# Patient Record
Sex: Male | Born: 1970 | Race: White | Hispanic: No | Marital: Married | Smoking: Never smoker
Health system: Southern US, Community
[De-identification: ages and names within clinical notes are randomized; demographics above are authoritative.]

## PROBLEM LIST (undated history)

## (undated) DIAGNOSIS — I471 Supraventricular tachycardia, unspecified: Secondary | ICD-10-CM

## (undated) DIAGNOSIS — E7800 Pure hypercholesterolemia, unspecified: Secondary | ICD-10-CM

## (undated) DIAGNOSIS — I10 Essential (primary) hypertension: Secondary | ICD-10-CM

## (undated) DIAGNOSIS — I499 Cardiac arrhythmia, unspecified: Secondary | ICD-10-CM

## (undated) HISTORY — PX: CARDIAC ELECTROPHYSIOLOGY MAPPING AND ABLATION: SHX1292

## (undated) HISTORY — PX: EYE SURGERY: SHX253

---

## 2004-09-30 ENCOUNTER — Other Ambulatory Visit: Payer: Self-pay

## 2004-09-30 ENCOUNTER — Emergency Department: Payer: Self-pay | Admitting: Emergency Medicine

## 2004-12-11 ENCOUNTER — Emergency Department: Payer: Self-pay | Admitting: Emergency Medicine

## 2005-02-28 ENCOUNTER — Emergency Department (HOSPITAL_COMMUNITY): Admission: EM | Admit: 2005-02-28 | Discharge: 2005-03-01 | Payer: Self-pay | Admitting: Emergency Medicine

## 2005-06-26 ENCOUNTER — Emergency Department (HOSPITAL_COMMUNITY): Admission: EM | Admit: 2005-06-26 | Discharge: 2005-06-26 | Payer: Self-pay | Admitting: Emergency Medicine

## 2005-07-22 ENCOUNTER — Emergency Department: Payer: Self-pay | Admitting: Emergency Medicine

## 2005-08-06 ENCOUNTER — Emergency Department (HOSPITAL_COMMUNITY): Admission: EM | Admit: 2005-08-06 | Discharge: 2005-08-06 | Payer: Self-pay | Admitting: Emergency Medicine

## 2006-04-29 ENCOUNTER — Emergency Department (HOSPITAL_COMMUNITY): Admission: EM | Admit: 2006-04-29 | Discharge: 2006-04-29 | Payer: Self-pay | Admitting: Emergency Medicine

## 2006-08-31 ENCOUNTER — Emergency Department: Payer: Self-pay | Admitting: Emergency Medicine

## 2007-02-15 ENCOUNTER — Emergency Department: Payer: Self-pay | Admitting: Emergency Medicine

## 2007-03-07 ENCOUNTER — Other Ambulatory Visit: Payer: Self-pay

## 2007-03-07 ENCOUNTER — Emergency Department: Payer: Self-pay | Admitting: Emergency Medicine

## 2007-09-02 ENCOUNTER — Emergency Department: Payer: Self-pay | Admitting: Emergency Medicine

## 2007-10-23 ENCOUNTER — Emergency Department: Payer: Self-pay | Admitting: Emergency Medicine

## 2008-01-16 ENCOUNTER — Emergency Department: Payer: Self-pay | Admitting: Unknown Physician Specialty

## 2008-01-20 ENCOUNTER — Emergency Department: Payer: Self-pay | Admitting: Emergency Medicine

## 2008-04-08 ENCOUNTER — Emergency Department: Payer: Self-pay | Admitting: Emergency Medicine

## 2008-07-08 ENCOUNTER — Emergency Department: Payer: Self-pay | Admitting: Emergency Medicine

## 2008-12-11 ENCOUNTER — Emergency Department: Payer: Self-pay | Admitting: Emergency Medicine

## 2009-03-10 ENCOUNTER — Emergency Department (HOSPITAL_COMMUNITY): Admission: EM | Admit: 2009-03-10 | Discharge: 2009-03-11 | Payer: Self-pay | Admitting: Emergency Medicine

## 2009-05-26 ENCOUNTER — Emergency Department: Payer: Self-pay | Admitting: Unknown Physician Specialty

## 2009-05-31 ENCOUNTER — Inpatient Hospital Stay: Payer: Self-pay | Admitting: Internal Medicine

## 2009-08-20 ENCOUNTER — Emergency Department: Payer: Self-pay | Admitting: Unknown Physician Specialty

## 2009-10-23 ENCOUNTER — Emergency Department: Payer: Self-pay | Admitting: Emergency Medicine

## 2009-11-05 ENCOUNTER — Emergency Department: Payer: Self-pay | Admitting: Unknown Physician Specialty

## 2010-02-20 ENCOUNTER — Emergency Department: Payer: Self-pay | Admitting: Emergency Medicine

## 2010-03-12 ENCOUNTER — Emergency Department: Payer: Self-pay | Admitting: Emergency Medicine

## 2010-03-22 ENCOUNTER — Emergency Department: Payer: Self-pay | Admitting: Emergency Medicine

## 2010-05-01 ENCOUNTER — Emergency Department: Payer: Self-pay | Admitting: Emergency Medicine

## 2010-05-04 LAB — POCT I-STAT, CHEM 8
Chloride: 100 mEq/L (ref 96–112)
Creatinine, Ser: 1.3 mg/dL (ref 0.4–1.5)
HCT: 45 % (ref 39.0–52.0)
Hemoglobin: 15.3 g/dL (ref 13.0–17.0)
Potassium: 3.4 mEq/L — ABNORMAL LOW (ref 3.5–5.1)
Sodium: 136 mEq/L (ref 135–145)

## 2010-05-04 LAB — URINALYSIS, ROUTINE W REFLEX MICROSCOPIC
Ketones, ur: NEGATIVE mg/dL
Nitrite: NEGATIVE
Protein, ur: NEGATIVE mg/dL
Urobilinogen, UA: 1 mg/dL (ref 0.0–1.0)

## 2010-05-12 ENCOUNTER — Emergency Department: Payer: Self-pay | Admitting: Unknown Physician Specialty

## 2010-05-14 ENCOUNTER — Emergency Department: Payer: Self-pay | Admitting: Emergency Medicine

## 2010-10-07 ENCOUNTER — Emergency Department (HOSPITAL_COMMUNITY): Payer: Self-pay

## 2010-10-07 ENCOUNTER — Emergency Department (HOSPITAL_COMMUNITY)
Admission: EM | Admit: 2010-10-07 | Discharge: 2010-10-07 | Disposition: A | Discharge status: Home / Self Care | Payer: Self-pay | Attending: Emergency Medicine | Admitting: Emergency Medicine

## 2010-10-07 DIAGNOSIS — E785 Hyperlipidemia, unspecified: Secondary | ICD-10-CM | POA: Insufficient documentation

## 2010-10-07 DIAGNOSIS — R079 Chest pain, unspecified: Secondary | ICD-10-CM | POA: Insufficient documentation

## 2010-10-07 DIAGNOSIS — I2510 Atherosclerotic heart disease of native coronary artery without angina pectoris: Secondary | ICD-10-CM | POA: Insufficient documentation

## 2010-10-07 DIAGNOSIS — R21 Rash and other nonspecific skin eruption: Secondary | ICD-10-CM | POA: Insufficient documentation

## 2010-10-07 LAB — COMPREHENSIVE METABOLIC PANEL
ALT: 12 U/L (ref 0–53)
AST: 18 U/L (ref 0–37)
Albumin: 3.9 g/dL (ref 3.5–5.2)
Alkaline Phosphatase: 102 U/L (ref 39–117)
Calcium: 9.1 mg/dL (ref 8.4–10.5)
Glucose, Bld: 93 mg/dL (ref 70–99)
Potassium: 4.1 mEq/L (ref 3.5–5.1)
Sodium: 140 mEq/L (ref 135–145)
Total Protein: 7.3 g/dL (ref 6.0–8.3)

## 2010-10-07 LAB — CBC
HCT: 44.1 % (ref 39.0–52.0)
MCHC: 33.6 g/dL (ref 30.0–36.0)
MCV: 87.2 fL (ref 78.0–100.0)
Platelets: 269 10*3/uL (ref 150–400)
RDW: 12.9 % (ref 11.5–15.5)
WBC: 7.5 10*3/uL (ref 4.0–10.5)

## 2010-10-18 ENCOUNTER — Emergency Department: Payer: Self-pay | Admitting: Emergency Medicine

## 2010-11-20 ENCOUNTER — Emergency Department: Payer: Self-pay | Admitting: Emergency Medicine

## 2011-01-18 ENCOUNTER — Emergency Department: Payer: Self-pay | Admitting: Emergency Medicine

## 2011-03-05 ENCOUNTER — Emergency Department: Payer: Self-pay | Admitting: Unknown Physician Specialty

## 2011-07-12 ENCOUNTER — Emergency Department: Payer: Self-pay | Admitting: Emergency Medicine

## 2011-07-12 LAB — BASIC METABOLIC PANEL
BUN: 18 mg/dL (ref 7–18)
Calcium, Total: 8.7 mg/dL (ref 8.5–10.1)
EGFR (African American): >60

## 2011-07-12 LAB — CBC
HGB: 15.8 g/dL (ref 13.0–18.0)
MCH: 30.6 pg (ref 26.0–34.0)
MCHC: 34.2 g/dL (ref 32.0–36.0)
MCV: 89 fL (ref 80–100)
RBC: 5.17 10*6/uL (ref 4.40–5.90)

## 2011-07-12 LAB — CK TOTAL AND CKMB (NOT AT ARMC): CK, Total: 184 U/L (ref 35–232)

## 2011-07-21 ENCOUNTER — Ambulatory Visit: Payer: Self-pay | Admitting: Surgery

## 2011-07-21 LAB — COMPREHENSIVE METABOLIC PANEL
Alkaline Phosphatase: 126 U/L (ref 50–136)
Anion Gap: 7 (ref 7–16)
BUN: 18 mg/dL (ref 7–18)
Bilirubin,Total: 0.3 mg/dL (ref 0.2–1.0)
Calcium, Total: 8.8 mg/dL (ref 8.5–10.1)
Co2: 30 mmol/L (ref 21–32)
EGFR (African American): >60
Potassium: 3.9 mmol/L (ref 3.5–5.1)
SGPT (ALT): 31 U/L
Total Protein: 7.5 g/dL (ref 6.4–8.2)

## 2011-07-21 LAB — CBC WITH DIFFERENTIAL/PLATELET
Eosinophil #: 0.3 10*3/uL (ref 0.0–0.7)
Eosinophil %: 2.4 %
Lymphocyte %: 27.3 %
MCH: 30.8 pg (ref 26.0–34.0)
Monocyte %: 7.7 %
Neutrophil #: 6.5 10*3/uL (ref 1.4–6.5)
Platelet: 270 10*3/uL (ref 150–440)
RDW: 12.9 % (ref 11.5–14.5)

## 2011-07-22 ENCOUNTER — Ambulatory Visit: Payer: Self-pay | Admitting: Surgery

## 2011-07-30 ENCOUNTER — Emergency Department: Payer: Self-pay | Admitting: Emergency Medicine

## 2011-07-30 LAB — CBC
HGB: 16.1 g/dL (ref 13.0–18.0)
MCH: 30.1 pg (ref 26.0–34.0)
MCV: 91 fL (ref 80–100)
RBC: 5.37 10*6/uL (ref 4.40–5.90)
WBC: 11.8 10*3/uL — ABNORMAL HIGH (ref 3.8–10.6)

## 2011-07-30 LAB — URINALYSIS, COMPLETE
Bacteria: NONE SEEN
Bilirubin,UR: NEGATIVE
Blood: NEGATIVE
Ketone: NEGATIVE
Nitrite: NEGATIVE
Protein: NEGATIVE
RBC,UR: 7 /HPF (ref 0–5)
Specific Gravity: 1.027 (ref 1.003–1.030)
Squamous Epithelial: 1

## 2011-07-30 LAB — LIPASE, BLOOD: Lipase: 72 U/L — ABNORMAL LOW (ref 73–393)

## 2011-07-30 LAB — COMPREHENSIVE METABOLIC PANEL
Albumin: 3.8 g/dL (ref 3.4–5.0)
BUN: 15 mg/dL (ref 7–18)
Co2: 28 mmol/L (ref 21–32)
EGFR (African American): >60
SGOT(AST): 22 U/L (ref 15–37)
Sodium: 138 mmol/L (ref 136–145)

## 2011-08-04 ENCOUNTER — Emergency Department: Payer: Self-pay | Admitting: Emergency Medicine

## 2011-08-04 LAB — CBC
HCT: 41.4 % (ref 40.0–52.0)
HGB: 14 g/dL (ref 13.0–18.0)
MCHC: 33.9 g/dL (ref 32.0–36.0)
MCV: 89 fL (ref 80–100)
Platelet: 253 10*3/uL (ref 150–440)
RBC: 4.65 10*6/uL (ref 4.40–5.90)

## 2011-08-04 LAB — BASIC METABOLIC PANEL
Anion Gap: 7 (ref 7–16)
Chloride: 105 mmol/L (ref 98–107)
Glucose: 104 mg/dL — ABNORMAL HIGH (ref 65–99)
Sodium: 141 mmol/L (ref 136–145)

## 2011-08-04 LAB — URINALYSIS, COMPLETE
Bacteria: NONE SEEN
Bilirubin,UR: NEGATIVE
Blood: NEGATIVE
Glucose,UR: NEGATIVE mg/dL (ref 0–75)
Ph: 6 (ref 4.5–8.0)
Protein: NEGATIVE
RBC,UR: 1 /HPF (ref 0–5)
Specific Gravity: 1.02 (ref 1.003–1.030)
WBC UR: 1 /HPF (ref 0–5)

## 2011-08-07 LAB — URINALYSIS, COMPLETE
Bilirubin,UR: NEGATIVE
Blood: NEGATIVE
Leukocyte Esterase: NEGATIVE
Nitrite: NEGATIVE
Ph: 5 (ref 4.5–8.0)
Specific Gravity: 1.028 (ref 1.003–1.030)

## 2011-08-07 LAB — COMPREHENSIVE METABOLIC PANEL
BUN: 14 mg/dL (ref 7–18)
Bilirubin,Total: 0.5 mg/dL (ref 0.2–1.0)
Chloride: 103 mmol/L (ref 98–107)
Co2: 29 mmol/L (ref 21–32)
Creatinine: 1.3 mg/dL (ref 0.60–1.30)
EGFR (Non-African Amer.): >60
Glucose: 86 mg/dL (ref 65–99)
Sodium: 139 mmol/L (ref 136–145)

## 2011-08-07 LAB — CBC
HGB: 14.8 g/dL (ref 13.0–18.0)
MCH: 29.6 pg (ref 26.0–34.0)
MCV: 89 fL (ref 80–100)
RBC: 5.02 10*6/uL (ref 4.40–5.90)
RDW: 12.8 % (ref 11.5–14.5)
WBC: 14.5 10*3/uL — ABNORMAL HIGH (ref 3.8–10.6)

## 2011-08-08 ENCOUNTER — Inpatient Hospital Stay: Payer: Self-pay | Admitting: Surgery

## 2011-08-08 LAB — CBC WITH DIFFERENTIAL/PLATELET
Basophil #: 0 10*3/uL (ref 0.0–0.1)
Eosinophil #: 0.3 10*3/uL (ref 0.0–0.7)
MCHC: 34 g/dL (ref 32.0–36.0)
MCV: 88 fL (ref 80–100)
Neutrophil #: 6.8 10*3/uL — ABNORMAL HIGH (ref 1.4–6.5)
Neutrophil %: 62.7 %
WBC: 10.8 10*3/uL — ABNORMAL HIGH (ref 3.8–10.6)

## 2011-08-09 LAB — BASIC METABOLIC PANEL
Calcium, Total: 8.2 mg/dL — ABNORMAL LOW (ref 8.5–10.1)
Chloride: 108 mmol/L — ABNORMAL HIGH (ref 98–107)
Creatinine: 1.27 mg/dL (ref 0.60–1.30)
Osmolality: 284 (ref 275–301)
Potassium: 3.9 mmol/L (ref 3.5–5.1)

## 2011-08-09 LAB — CBC WITH DIFFERENTIAL/PLATELET
Basophil #: 0 10*3/uL (ref 0.0–0.1)
Basophil %: 0.5 %
HCT: 42 % (ref 40.0–52.0)
HGB: 13.8 g/dL (ref 13.0–18.0)
MCH: 29.1 pg (ref 26.0–34.0)
MCHC: 32.8 g/dL (ref 32.0–36.0)
Monocyte #: 1.1 x10 3/mm — ABNORMAL HIGH (ref 0.2–1.0)
Neutrophil #: 5.1 10*3/uL (ref 1.4–6.5)
Neutrophil %: 53.2 %
RBC: 4.73 10*6/uL (ref 4.40–5.90)
RDW: 12.7 % (ref 11.5–14.5)

## 2011-08-10 LAB — KOH PREP

## 2012-02-15 ENCOUNTER — Emergency Department: Payer: Self-pay | Admitting: Emergency Medicine

## 2012-02-15 LAB — CBC
Platelet: 296 10*3/uL (ref 150–440)
RBC: 5.37 10*6/uL (ref 4.40–5.90)
RDW: 13.5 % (ref 11.5–14.5)
WBC: 12.8 10*3/uL — ABNORMAL HIGH (ref 3.8–10.6)

## 2012-02-15 LAB — BASIC METABOLIC PANEL
Anion Gap: 9 (ref 7–16)
Calcium, Total: 8.5 mg/dL (ref 8.5–10.1)
Co2: 28 mmol/L (ref 21–32)
EGFR (African American): >60
EGFR (Non-African Amer.): >60
Osmolality: 281 (ref 275–301)

## 2012-02-15 LAB — TROPONIN I: Troponin-I: <0.02 ng/mL

## 2012-03-07 ENCOUNTER — Emergency Department: Payer: Self-pay | Admitting: Emergency Medicine

## 2012-03-07 LAB — COMPREHENSIVE METABOLIC PANEL
Alkaline Phosphatase: 126 U/L (ref 50–136)
Anion Gap: 6 — ABNORMAL LOW (ref 7–16)
Bilirubin,Total: 0.4 mg/dL (ref 0.2–1.0)
Calcium, Total: 8.4 mg/dL — ABNORMAL LOW (ref 8.5–10.1)
Creatinine: 1.33 mg/dL — ABNORMAL HIGH (ref 0.60–1.30)
EGFR (Non-African Amer.): >60
Osmolality: 280 (ref 275–301)
Potassium: 3.6 mmol/L (ref 3.5–5.1)
SGOT(AST): 19 U/L (ref 15–37)
Sodium: 140 mmol/L (ref 136–145)
Total Protein: 7.5 g/dL (ref 6.4–8.2)

## 2012-03-07 LAB — CBC
HCT: 45.6 % (ref 40.0–52.0)
MCV: 89 fL (ref 80–100)
Platelet: 228 10*3/uL (ref 150–440)
RBC: 5.13 10*6/uL (ref 4.40–5.90)

## 2012-03-08 LAB — PROTIME-INR
INR: 0.9
Prothrombin Time: 12.9 secs (ref 11.5–14.7)

## 2013-01-23 ENCOUNTER — Emergency Department: Payer: Self-pay | Admitting: Emergency Medicine

## 2013-01-23 LAB — BASIC METABOLIC PANEL
Anion Gap: 6 — ABNORMAL LOW (ref 7–16)
BUN: 18 mg/dL (ref 7–18)
EGFR (African American): >60

## 2013-01-23 LAB — CBC
HGB: 15.5 g/dL (ref 13.0–18.0)
MCH: 29.7 pg (ref 26.0–34.0)
Platelet: 281 10*3/uL (ref 150–440)

## 2013-01-23 LAB — TROPONIN I: Troponin-I: <0.02 ng/mL

## 2013-01-23 LAB — MAGNESIUM: Magnesium: 2.2 mg/dL

## 2013-06-10 ENCOUNTER — Emergency Department: Payer: Self-pay | Admitting: Emergency Medicine

## 2013-06-11 ENCOUNTER — Emergency Department: Payer: Self-pay | Admitting: Emergency Medicine

## 2014-02-08 IMAGING — US ABDOMEN ULTRASOUND LIMITED
1 series · 14 of 25 positions shown · non-contrast
Comparison: none

REASON FOR EXAM: RUQ pain, nausea
COMMENTS:   Body Site: GB and Fossa, CBD, Head of Pancreas

PROCEDURE:     US  - US ABDOMEN LIMITED SURVEY  - July 13, 2011  [DATE]
RESULT:

[Series 1: abdomen ultrasound limited · 0.31mm/px · 14 of 32 slices shown]
[im 1/32]
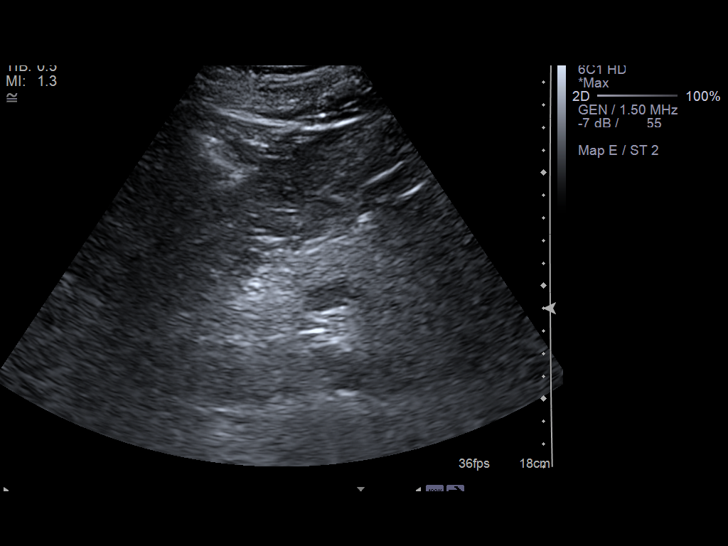
[im 3/32]
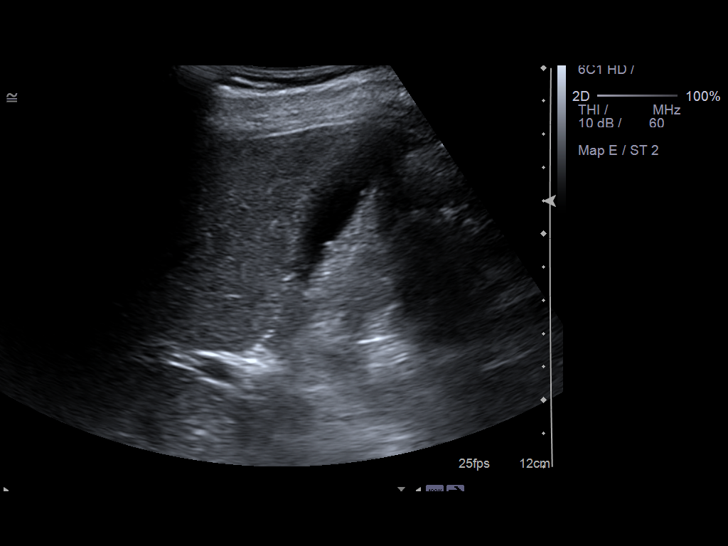
[im 6/32]
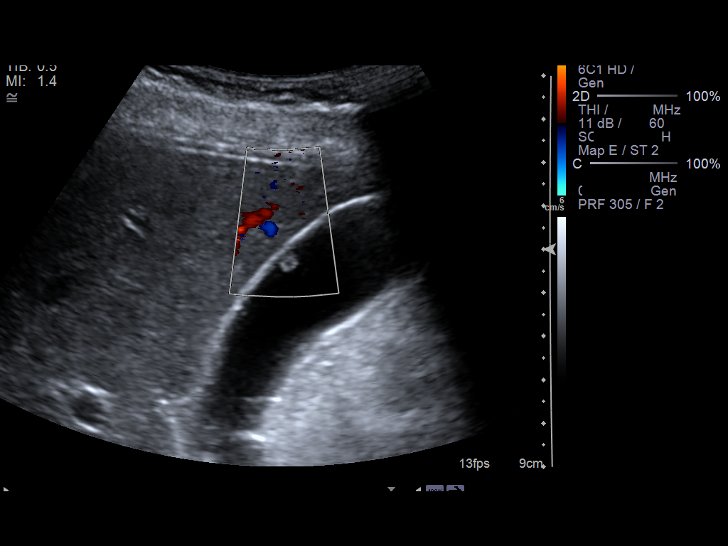
[im 8/32]
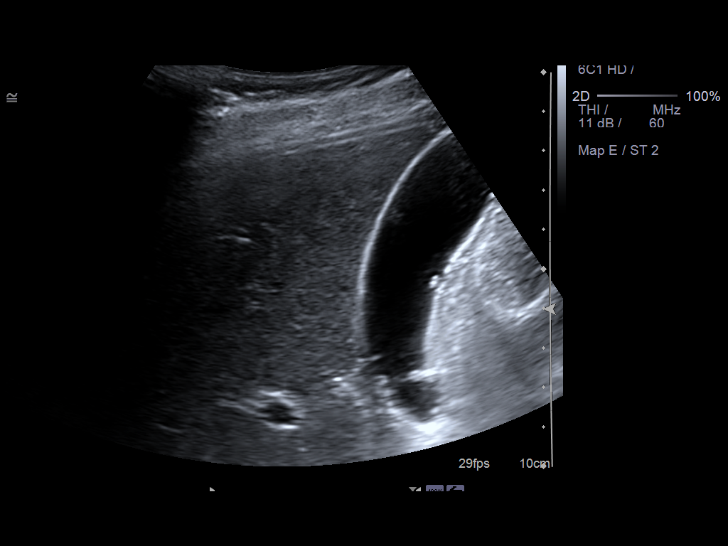
[im 11/32]
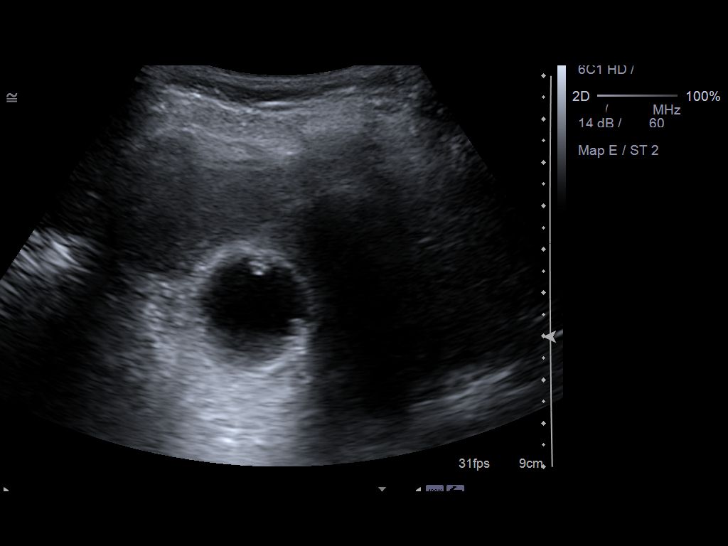
[im 12/32]
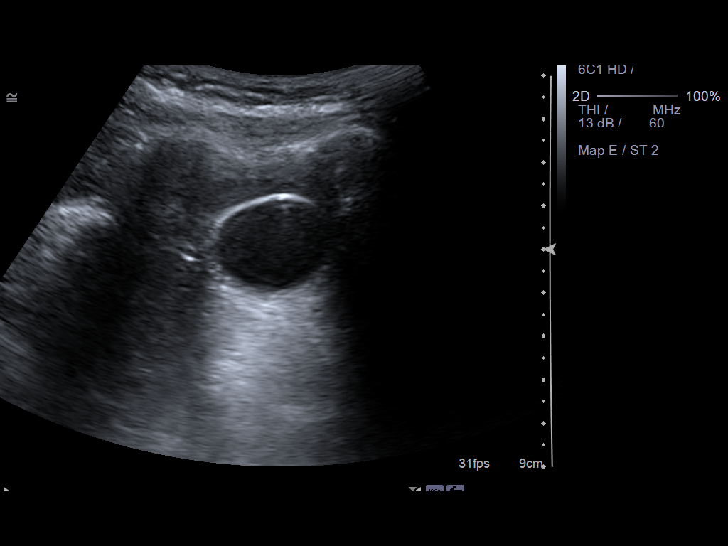
[im 15/32]
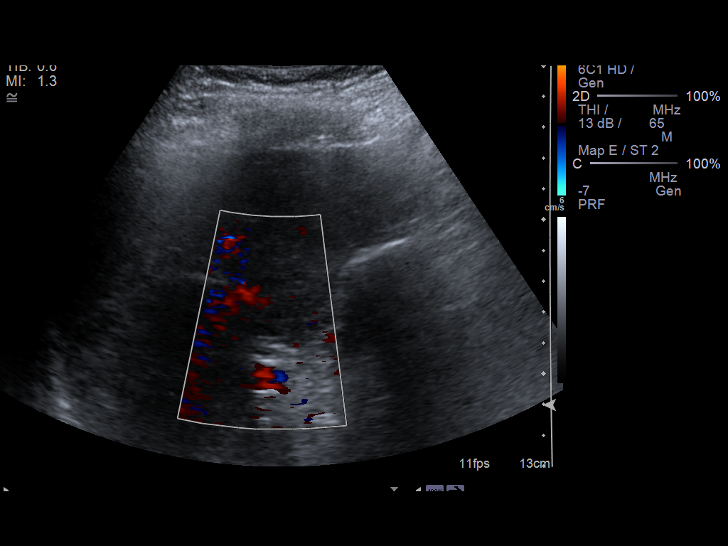
[im 17/32]
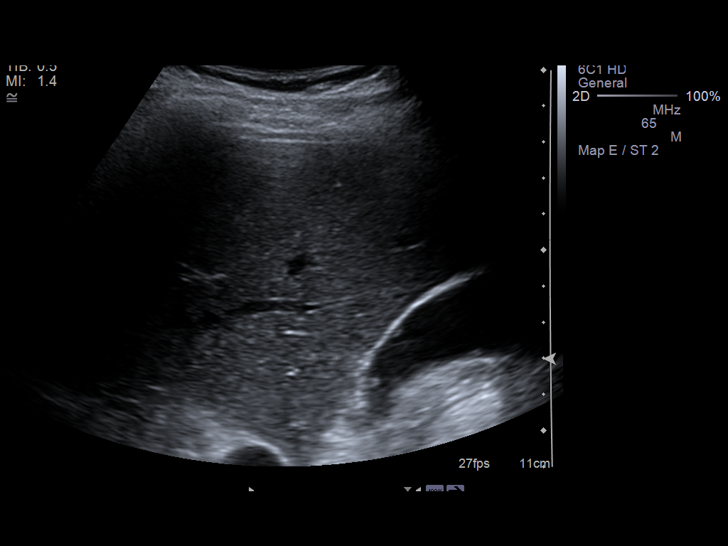
[im 20/32]
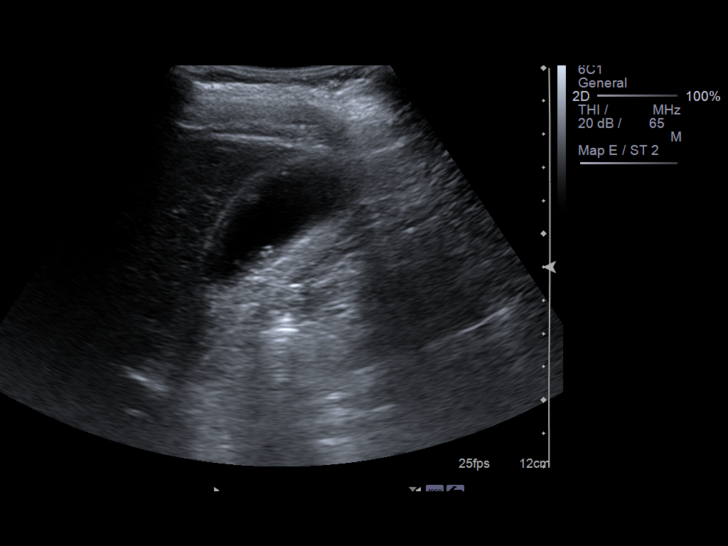
[im 21/32]
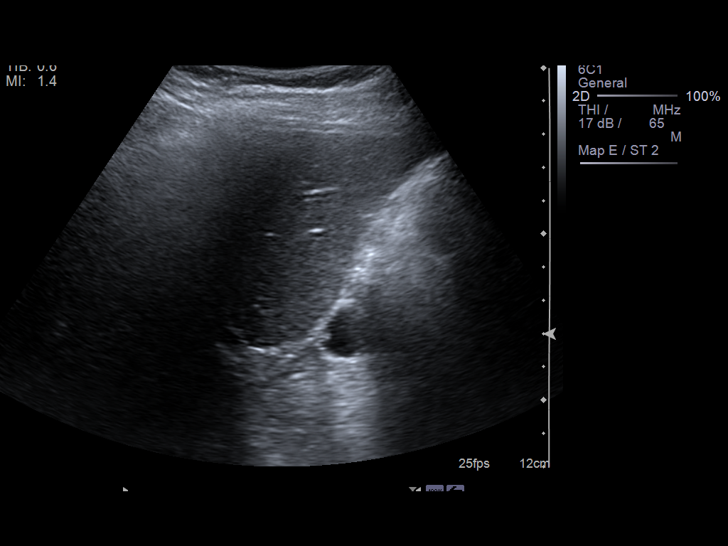
[im 24/32]
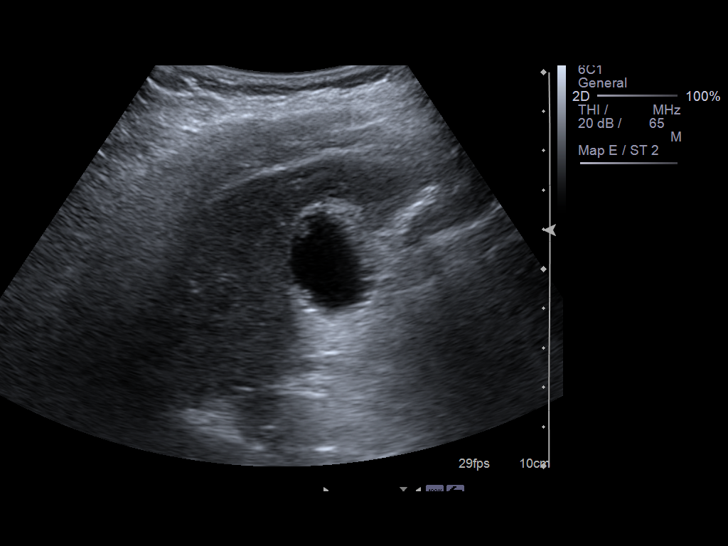
[im 26/32]
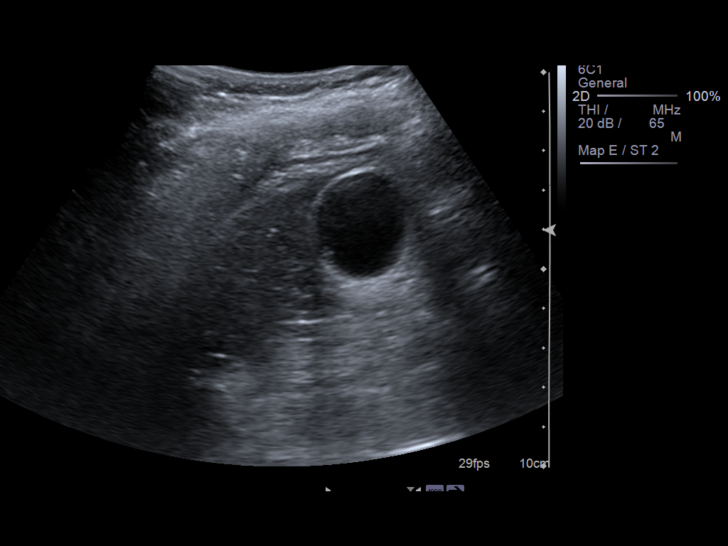
[im 29/32]
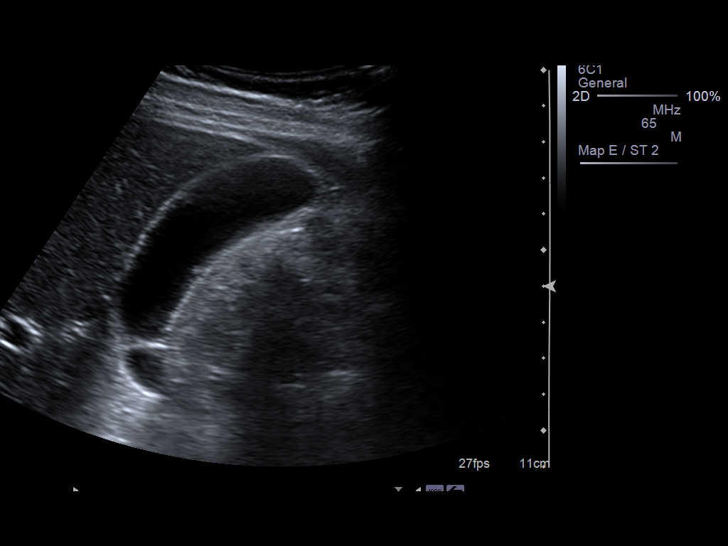
[im 32/32]
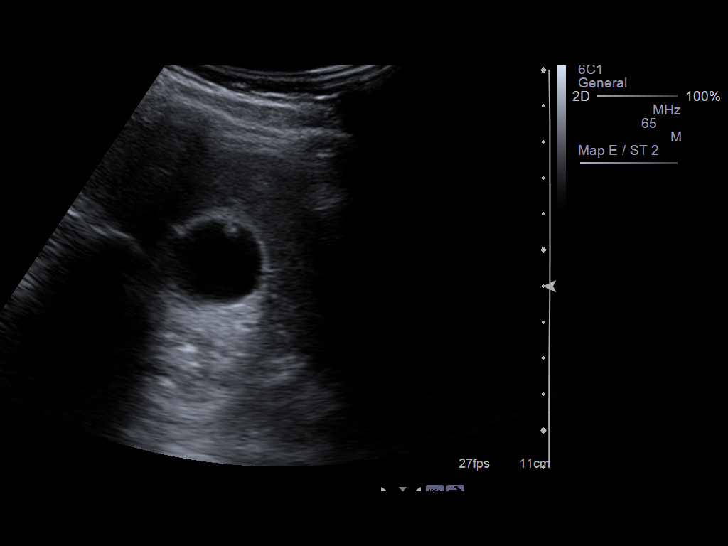

[14 of 25 positions shown; findings below may reference images not displayed]

FINDINGS: Evaluation of the gallbladder demonstrates multiple small
echogenic nonshadowing mural foci within the gallbladder like representing
multiple polyps. There is no evidence of gallbladder wall thickening,
pericholecystic fluid or common bile duct dilatation. The common bile duct
is 3.5 mm in thickness and the gallbladder wall is 2 mm. Hepatopetal flow is
identified within the portal vein. The pancreatic head is unremarkable.

There does not appear to be sonographic evidence of sludging. Adherent
gallstones within the gallbladder wall region likely reflecting polyps
cannot be completely excluded.
IMPRESSION: 1.  No sonographic evidence of cholecystitis.
2.  Findings likely representing polyps within the gallbladder and small,
adherent gallstones, noncalcified, cannot be excluded.
3.  Dr. Verenice of the Emergency Department was informed of these findings via
a preliminary faxed report dated 07/13/2011 at [DATE], EDT.

## 2014-06-10 NOTE — Consult Note (Signed)
PATIENT NAME:  Collin Mitchell, Collin Mitchell MR#:  [id number] DATE OF BIRTH:  1970/04/22  DATE OF CONSULTATION:  08/08/2011  REFERRING PHYSICIAN:  Dr. Michela Pitcher  CONSULTING PHYSICIAN:  Christena Deem, MD  REASON FOR CONSULTATION: Hematemesis, abdominal pain.   HISTORY OF PRESENT ILLNESS: Mr. Neels is a 44 year old Caucasian male who came to the hospital earlier today with complaint of abdominal pain. He has been seen recently in regards to possible cholecystectomy and cholecystitis. There is apparently some plan for cholecystectomy. He states that about a week ago he had multiple symptoms begin to include nausea as well as epigastric and right upper quadrant pain. He has no personal history of peptic ulcer disease and has been on medication for reflux up until about a month ago when he ran out of the prescription. He denies any dysphagia. He said he does have heartburn every night. He states he has never had an upper scope done. He does take Marlin Canary powders about once a month, the last time this previous Monday. He states that when these symptoms began they got worse over the course of the week until on Wednesday he had several episodes of bloody emesis. He relates this not as been a bright red or coffee-ground or maroon material, but rather quotes it as being "pinkish". This has not recurred since Thursday, the following day to the initial symptom. He states that he did have a black bowel movement on Thursday but that has not recurred either. He was seen at [facility] ER on the 17th and was told at that time he had pneumonia, was given a course of Levaquin. Three days ago he presented to the Emergency Room at [facility] with abdominal pain. At that time he had relatively normal labs and a chest x-ray was unremarkable. He was told to continue his antibiotics and went home. He does relate having night sweats over the past week or so and has continued with epigastric and right upper quadrant pain. Again, he has had no recurrent episode of  emesis or hematemesis since the past 2 to 3 days.   GI FAMILY HISTORY: Negative for colorectal cancer, liver disease, or ulcers.   PAST MEDICAL HISTORY:  1. He was admitted to [facility] for atypical chest pain in 2011 at that time undergoing cardiac evaluation which was normal.  2. History of hypertension which patient states is mild and he does not take any medications for that.  3. History of hypercholesterolemia for which he was on Lipitor; that drug being discontinued due to abnormal liver associated enzymes.  4. He denies any other ongoing medical treatment or hospitalizations or surgeries.   SOCIAL HISTORY: Patient does not use cigarettes and he does not use alcohol. 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    REVIEW OF SYSTEMS: Patient continues to have a cough which has been relatively stable over the past several weeks, was productive a week ago, currently not as bad. It is of note the patient relates having a lot of coughing prior to the episode of emesis at which time he saw the pinkish material.    ALLERGIES: Penicillin.   OUTPATIENT MEDICATIONS: Levaquin 750 mg once a day. He had previously been on Protonix.   PHYSICAL EXAMINATION:  VITAL SIGNS: Temperature 97.7, pulse 59, respirations 16, blood pressure 121/80, pulse oximetry 97% on room air.   GENERAL: He is a 44 year old Caucasian male coughing multiple times during course of the interview. This seems to be nonproductive.  HEENT: Normocephalic, atraumatic. Eyes are anicteric. Nose septum midline. No lesions. Oropharynx no lesions.   NECK: Supple. No JVD. No lymphadenopathy. No thyromegaly.   HEART: Regular rate and rhythm.   LUNGS: Clear.   ABDOMEN: Soft. He is tender to palpation in the mid epigastric region as well as into the right upper quadrant just below the costal margin itself. He also has some discomfort to palpation in the right lower quadrant. There are no masses, rebound, or  organomegaly. Bowel sounds positive, normoactive.   RECTAL: Anorectal exam is deferred.   EXTREMITIES: No clubbing, cyanosis, or edema.   NEUROLOGICAL: Cranial nerves II through XII grossly intact. Muscle strength bilaterally equal and symmetric, 5/5. Deep tendon reflexes bilaterally equal and symmetric.   MEDICATIONS: It is of note his current medications include:  1. Antinausea medication. 2. Ondansetron. 3. Hydromorphone. 4. Pantoprazole. 5. Zosyn.  6. Morphine.   LABORATORY, DIAGNOSTIC, AND RADIOLOGICAL DATA: On admission to the hospital yesterday he had a glucose 86, BUN 14, creatinine 1.30, sodium 139, potassium 3.8, chloride 103, bicarbonate 29, lipase 88. Normal liver associated enzymes. His hemogram yesterday showed white count 14.5, hemoglobin and hematocrit 14.8/44.6, platelet count 363. MCV 89. Repeat hemogram today shows white count 10.8. Urinalysis 30 mg/dL protein. Yesterday he had a PA and lateral chest film showing no acute cardiopulmonary disease and a "stable appearance". He had an abdominal ultrasound earlier today showing persistent gallbladder polyps. No findings to suggest acute cholecystitis. No ascites present.   ASSESSMENT AND RECOMMENDATIONS: Hematemesis as noted above. Patient has had a normal hemoglobin and hematocrit. Platelet count also is normal. His last episode of hematemesis was 2 to 3 days ago. He has had no black stools with the exception of one three days ago. It is quite possible he had a small Mallory-Weiss tear associated with cough and  hematemesis, although there is also the possibility that the "pinkish" material that he threw up was also sputum related to the previous pneumonia. He does have epigastric pain as well as right upper quadrant pain. Concern for possible cystic duct obstruction and agree with consideration of hepatobiliary scan. Further, do recommend EGD. We will arrange this for Monday as he has had no recurrent hematemesis for the last three  days. I would continue IV PPI as you are and clears. I have discussed the risks, benefits, and complications of EGD to include not limited to bleeding, infection, perforation, and the risk of sedation and he wishes to proceed. I will need to reassess how he is doing from a pulmonary standpoint with his cough. It is of note that there was no pneumonia on his x-rays on admission.  ____________________________ Christena DeemMartin U. Alahia Whicker, MD mus:cms D: 08/08/2011 18:06:01 ET T: 08/09/2011 07:43:32 ET  JOB#: [id number] cc: Christena DeemMartin U. Ravinder Lukehart, MD, <Dictator> Carmie Endalph L. Ely III, MD Christena DeemMARTIN U Julie-Ann Vanmaanen MD ELECTRONICALLY SIGNED 08/12/2011 12:08

## 2014-06-10 NOTE — H&P (Signed)
PATIENT NAME:  Collin Mitchell, Collin Mitchell MR#:  [id number] DATE OF BIRTH:  Apr 21, 1970  DATE OF ADMISSION:  08/08/2011  PRIMARY CARE PHYSICIAN: None.   ADMITTING PHYSICIAN: Dr. Michela Pitcher.   CHIEF COMPLAINT: Abdominal pain, hematemesis.   BRIEF HISTORY: Collin Mitchell is a 44 year old gentleman seen in the emergency room with a several-day history of abdominal pain. He has had primarily right upper quadrant, right flank, right midepigastric abdominal pain which has been intermittent but severe. He has also had a chronic cough. He was seen in the Emergency Room at [facility] on the 17th with these symptoms, and workup at that time allegedly demonstrated pneumonia. He was placed on p.o. Levaquin, has been taking it since that evaluation. He continued to have abdominal pain, and his vomiting worsened over the week. He presented to the emergency room at [facility] on Wednesday with abdominal pain and syncope. I do not have any evidence of that workup at the present time. He did not have an elevated white blood cell count. Chest x-ray did not reveal any significant changes at that time, and he was encouraged to continue his antibiotic therapy. Last evening the pain increased once again. The pain is primarily right upper quadrant, right flank associated with eating as he is anorexic, has no appetite; and the pain appears to be worse after eating. Began to vomit again and was noted to have some blood in his vomitus. His wife relates there was a whole toilet bowl full of "blood."   He presented back to the emergency room for further evaluation. He noted that he had fever of 102 at home. He was afebrile at the hospital; his blood pressure was 97/60. His white blood cell count was 14,000 and his hemoglobin was 14 grams. Laboratory values were unremarkable. He had no elevation of his liver function studies. Gallbladder ultrasound was performed for the 3rd time in the last 2 years. He does have an evidence of possible  gallbladder wall polyps without evidence of any gallstones. He does not have any gallbladder wall thickening. He has normal size common bile duct. He has not had a nuclear medicine evaluation of his gallbladder.   He denies any history of hepatitis, yellow jaundice, pancreatitis, peptic ulcer disease or previous diagnosis of gallbladder disease. He denies any history of diverticulitis. He did have an admission in 2011 for atypical chest pain. At that time cardiac catheterization was performed after a positive stress test. He had normal coronaries noted at that time. He was seen by Dr. Excell Seltzer recently for consideration of possible biliary tract disease. The patient's symptoms were a bit atypical at that time, and he was set up for further workup. He did not show for his followup appointment. His family relates that he was at the [place] at [facility] emergency room at that time. He denies any previous history of pneumonia. He has had only previous eye surgery. He has not had any abdominal surgery.   SOCIAL HISTORY: He does not smoke cigarettes and does not drink alcohol. He works as a Financial risk analyst at [employer].   FAMILY HISTORY: Noncontributory other than the fact his father had severe hyperlipidemia and coronary artery disease with a myocardial infarction at an early age.   MEDICATIONS: He was previously on beta blockers but was discontinued at the time of his previous admission. He takes no other medications at the present time.   ALLERGIES: He is allergic to penicillin, which causes a rash.  REVIEW OF SYSTEMS: Is really unremarkable. He has no recent weight changes. Fever has been noted from his previous workup above. HEENT review of systems reveals no visual changes or hearing loss. He does have a cough but has not been short of breath and has no chest pain. CARDIAC: He has no palpitations or orthopnea. He does have a history of palpitations with his previous evaluation for  chest pain. GI as noted above. NEUROLOGIC: Unremarkable with no focal complaints.   PHYSICAL EXAMINATION:  GENERAL: He is lying in bed, appears to be uncomfortable.   VITALS: His blood pressure is 96/60, heart rate is 98 and regular. His oxygen saturation is 97%. He is afebrile.   HEENT: No scleral icterus. Pupils are equally round.   NECK: His neck is supple without adenopathy. His trachea is midline. I cannot palpate his thyroid gland.   PULMONARY: Distant but present breath sounds equal bilaterally with normal pulmonary excursion.   CARDIAC: No murmurs or gallops to my ear, and he seems to be in normal sinus rhythm.   ABDOMEN: Soft with some mild right upper quadrant midepigastric tenderness. He also has some right flank tenderness. He has active bowel sounds. No hernias or masses are noted. Some mild guarding but no rebound.   EXTREMITIES: Full range of motion, no deformity and 1+ distal pulses.   PSYCHIATRIC: Normal orientation and normal affect.   I have independently reviewed his imaging studies with the exception of his ultrasound. Laboratory values are slightly elevated. Reviewing his previous notes, did have an elevated lipase in 2011 at 630. It is normal today. Ultrasound report is not final but the verbal report suggests no gallbladder wall thickening but some mild sonographic Murphy's sign.    IMPRESSION: This gentleman continues to be symptomatic. The hematemesis is alarming as this symptom is new. Certainly cannot relate his hematemesis to his gallbladder disease. He does have a remote history of possible hiatal hernia with gastroesophageal reflux, which may be the source of his hematemesis where he may have developed a Mallory-Weiss issue with his ongoing vomiting. His hemoglobin is stable although he is mildly hypotensive. We will increase his fluid resuscitation, start him on antibiotic therapy for possible biliary tract disease. Consider HIDA scan and get a gastroenterology  consult to consider endoscopy with his symptoms of hematemesis. This plan has been discussed with the patient and his family. They are in agreement.    ____________________________ Carmie Endalph L. Ely III, MD rle:vtd D: 08/08/2011 04:09:29 ET T: 08/08/2011 09:00:48 ET JOB#: [id number]  cc: Quentin Orealph L. Ely III, MD, <Dictator> Quentin OreALPH L ELY MD ELECTRONICALLY SIGNED 08/08/2011 19:05

## 2014-06-10 NOTE — Consult Note (Signed)
Brief Consult Note: Diagnosis: hematemesis versus hemoptysis, epigastric and ruq pain.   Patient was seen by consultant.   Consult note dictated.   Recommend to proceed with surgery or procedure.   Recommend further assessment or treatment.   Comments: Please see full GI consult.  Patietn admitted with epigastric and ruq pain, recently evaluated for consideration of CCY.  Recent dx of pneumonia treated with po levaquin, cxr yesterday negative for pneumonia, he continues with a cough.  Patient related the episode of hematemesis as a "pinkish" material not occuring since thursday.  Possible M_W tear, although patiehnt also has daily gerd sx and has been off treatment for at least a month. Agree with EGD, will plan for monday, continue bid iv ppi, will trial non carbonated clears (no red), following. I have discussed the risks benefits and complications of egd to include not limited to bleeding infection perforation and sedation and he wishes to proceed.  Electronic Signatures for Addendum Section:  Barnetta ChapelSkulskie, Chuck Caban (MD) (Signed Addendum 22-Jun-13 18:12)  consult # [phone] [id number]   Electronic Signatures: Barnetta ChapelSkulskie, Rita Prom (MD)  (Signed [phone]-Jun-13 18:11)  Authored: Brief Consult Note   Last Updated: 22-Jun-13 18:12 by Barnetta ChapelSkulskie, Shiron Whetsel (MD)

## 2014-06-10 NOTE — Consult Note (Signed)
Chief Complaint:   Subjective/Chief Complaint doing wel, no n,v .  Mild epigastric and ruq discomfort. no bm since admission.   VITAL SIGNS/ANCILLARY NOTES: **Vital Signs.:   23-Jun-13 10:21   Vital Signs Type Q 4hr   Temperature Temperature (F) 98   Celsius 36.6   Temperature Source oral   Pulse Pulse 57   Pulse source per vital sign device   Respirations Respirations 18   Systolic BP Systolic BP 967   Diastolic BP (mmHg) Diastolic BP (mmHg) 68   Mean BP 82   BP Source vital sign device   Pulse Ox % Pulse Ox % 95   Pulse Ox Activity Level  At rest   Oxygen Delivery Room Air/ 21 %   Brief Assessment:   Cardiac Regular    Respiratory clear BS    Gastrointestinal details normal Soft  Nondistended  No masses palpable  Bowel sounds normal  No rebound tenderness  mild epigastric and ruq pain to palpation   Lab Results: Routine Chem:  23-Jun-13 05:03    Glucose, Serum  111   BUN 7   Creatinine (comp) 1.27   Sodium, Serum 143   Potassium, Serum 3.9   Chloride, Serum  108   CO2, Serum 29   Calcium (Total), Serum  8.2   Anion Gap  6   Osmolality (calc) 284   eGFR (African American) >60   eGFR (Non-African American) >60 (eGFR values <51m/min/1.73 m2 may be an indication of chronic kidney disease (CKD). Calculated eGFR is useful in patients with stable renal function. The eGFR calculation will not be reliable in acutely ill patients when serum creatinine is changing rapidly. It is not useful in  patients on dialysis. The eGFR calculation may not be applicable to patients at the low and high extremes of body sizes, pregnant women, and vegetarians.)  Routine Hem:  23-Jun-13 05:03    WBC (CBC) 9.5   RBC (CBC) 4.73   Hemoglobin (CBC) 13.8   Hematocrit (CBC) 42.0   Platelet Count (CBC) 317   MCV 89   MCH 29.1   MCHC 32.8   RDW 12.7   Neutrophil % 53.2   Lymphocyte % 30.9   Monocyte % 11.1   Eosinophil % 4.3   Basophil % 0.5   Neutrophil # 5.1   Lymphocyte # 2.9    Monocyte #  1.1   Eosinophil # 0.4   Basophil # 0.0 (Result(s) reported on 09 Aug 2011 at 06:01AM.)   Assessment/Plan:  Assessment/Plan:   Assessment 1) recent hematemesis-not recurrent, continues with epigastric and ruq pain, mild.  improved wbc.    Plan 1) continue bid ppi, egd planned for tomorrow.  I have discussed the risks benefits and complications of egd to include not limited to bleeding infection perforation and sedation and he wishes to proceed.  2) CCY when clinically feasible.   Electronic Signatures: SLoistine Simas(MD)  (Signed 23-Jun-13 14:16)  Authored: Chief Complaint, VITAL SIGNS/ANCILLARY NOTES, Brief Assessment, Lab Results, Assessment/Plan   Last Updated: 23-Jun-13 14:16 by SLoistine Simas(MD)

## 2014-06-10 NOTE — Consult Note (Signed)
Chief Complaint:   Subjective/Chief Complaint doing well no n/v minimal epigastric and ruq discomfort. no bm since admission.   VITAL SIGNS/ANCILLARY NOTES: **Vital Signs.:   24-Jun-13 09:41   Vital Signs Type Q 4hr   Temperature Temperature (F) 98   Celsius 36.6   Temperature Source oral   Pulse Pulse 54   Pulse source per vital sign device   Respirations Respirations 18   Systolic BP Systolic BP 428   Diastolic BP (mmHg) Diastolic BP (mmHg) 72   Mean BP 84   BP Source vital sign device   Pulse Ox % Pulse Ox % 97   Pulse Ox Activity Level  At rest   Oxygen Delivery Room Air/ 21 %   Brief Assessment:   Cardiac Regular    Respiratory clear BS    Gastrointestinal details normal Soft  Nondistended  No masses palpable  Bowel sounds normal  No rebound tenderness  mild tenderness in the epigastrum and ruq   Lab Results: Routine Chem:  23-Jun-13 05:03    Glucose, Serum  111   BUN 7   Creatinine (comp) 1.27   Sodium, Serum 143   Potassium, Serum 3.9   Chloride, Serum  108   CO2, Serum 29   Calcium (Total), Serum  8.2   Anion Gap  6   Osmolality (calc) 284   eGFR (African American) >60   eGFR (Non-African American) >60 (eGFR values <32mL/min/1.73 m2 may be an indication of chronic kidney disease (CKD). Calculated eGFR is useful in patients with stable renal function. The eGFR calculation will not be reliable in acutely ill patients when serum creatinine is changing rapidly. It is not useful in  patients on dialysis. The eGFR calculation may not be applicable to patients at the low and high extremes of body sizes, pregnant women, and vegetarians.)  Routine Hem:  23-Jun-13 05:03    WBC (CBC) 9.5   RBC (CBC) 4.73   Hemoglobin (CBC) 13.8   Hematocrit (CBC) 42.0   Platelet Count (CBC) 317   MCV 89   MCH 29.1   MCHC 32.8   RDW 12.7   Neutrophil % 53.2   Lymphocyte % 30.9   Monocyte % 11.1   Eosinophil % 4.3   Basophil % 0.5   Neutrophil # 5.1   Lymphocyte # 2.9    Monocyte #  1.1   Eosinophil # 0.4   Basophil # 0.0 (Result(s) reported on 09 Aug 2011 at 06:01AM.)   Assessment/Plan:  Assessment/Plan:   Assessment 1) reported hematemesis occurign several days ago.  no recurrence.  2) epigastric pain 3) ruq pain in the setting of possible gb polyps/gb disorder.    Plan 1) egd today.  I have discussed the risks benefits and complications of egd to include not limited to bleeding infection perforation and sedation have been explained to the patient and he wishes to proceed.  Further recs to follow.   Electronic Signatures: Loistine Simas (MD)  (Signed 24-Jun-13 13:01)  Authored: Chief Complaint, VITAL SIGNS/ANCILLARY NOTES, Brief Assessment, Lab Results, Assessment/Plan   Last Updated: 24-Jun-13 13:01 by Loistine Simas (MD)

## 2014-06-10 NOTE — Discharge Summary (Signed)
PATIENT NAME:  Collin JuryMURRAY, Zeth Mitchell MR#:  [id number] DATE OF BIRTH:  Sep 26, 1970  DATE OF ADMISSION:  08/08/2011 DATE OF DISCHARGE:  08/11/2011  BRIEF HISTORY: Collin Mitchell is a 44 year old gentleman seen in the Emergency Room with abdominal pain for several days. He has had intermittent abdominal discomfort over the last several weeks. He has been worked up on multiple occasions with no obvious findings. He did have some workup for chest pain. He had cardiac catheterization in 2011. Ultrasound at that time revealed polyps with no stones and no elevated liver function studies. He was seen at the [facility] earlier in the week with syncope and a follow-up ultrasound had the same findings. [facility] earlier in the [facility] with pneumonia and was started on p.o. antibiotics. He began to have increasing abdominal discomfort and began to vomit on the day prior to admission. He had temperature to 102 reportedly. He vomited some blood, "whole toilet bowl full" the evening prior to admission. His white blood cell count was slightly elevated and his hemoglobin was normal. Repeat ultrasound again revealed possible gallbladder polyp without evidence of acute cholecystitis. We admitted him to the hospital with the idea of pursuing rehydration, possible HIDA scan, EGD, and GI consultation. He was admitted to the hospital and placed on IV antibiotics. He was seen by the GI service. Dr. Barnetta ChapelMartin Skulskie [facility] recommended upper endoscopy. That procedure was performed on the 24th and demonstrated esophageal candidiasis and multiple esophageal and gastric ulcers. He was placed on PPI drugs. His symptoms improved. He was started on liquid diet and advanced to a full liquid diet with no particular problems. He was discharged home on the 25th to be followed by Dr. Marva PandaSkulskie in 10 days and in our office in two weeks.   DISCHARGE MEDICATIONS:  1. Mycelex 10 mg p.o. daily x5 days. 2. Pepcid 20 mg p.o. b.i.d.   3. He was given Vicodin for pain.   We anticipate completing his gallbladder work-up with a HIDA scan as an outpatient. This plan has been outlined to the patient and his family and they are in agreement.   FINAL DISCHARGE DIAGNOSES:  1. Abdominal pain. 2. Gallbladder polyp and cholesterolosis. 3. Esophageal candidiasis. 4. Esophageal and gastric ulcers.   ____________________________ Collin Orealph L. Mitchell III, MD rle:drc D: 08/14/2011 00:07:26 ET T: 08/14/2011 12:24:35 ET JOB#: [id number]  cc: Collin Endalph L. Mitchell III, MD, <Dictator> Steele SizerMark A. Crissman, MD Christena DeemMartin U. Skulskie, MD Collin OreALPH L ELY MD ELECTRONICALLY SIGNED 08/21/2011 7:52

## 2014-06-12 ENCOUNTER — Ambulatory Visit: Payer: Self-pay

## 2014-06-12 ENCOUNTER — Encounter: Payer: Self-pay | Admitting: Podiatry

## 2014-06-18 NOTE — Progress Notes (Signed)
No show

## 2014-06-28 ENCOUNTER — Ambulatory Visit: Payer: Self-pay | Admitting: Podiatry

## 2015-03-22 ENCOUNTER — Emergency Department
Admission: EM | Admit: 2015-03-22 | Discharge: 2015-03-22 | Disposition: A | Discharge status: Home / Self Care | Payer: Self-pay | Attending: Emergency Medicine | Admitting: Emergency Medicine

## 2015-03-22 ENCOUNTER — Emergency Department: Payer: Self-pay

## 2015-03-22 DIAGNOSIS — I471 Supraventricular tachycardia: Secondary | ICD-10-CM | POA: Insufficient documentation

## 2015-03-22 DIAGNOSIS — Z880 Allergy status to penicillin: Secondary | ICD-10-CM | POA: Insufficient documentation

## 2015-03-22 DIAGNOSIS — I499 Cardiac arrhythmia, unspecified: Secondary | ICD-10-CM | POA: Insufficient documentation

## 2015-03-22 HISTORY — DX: Supraventricular tachycardia: I47.1

## 2015-03-22 HISTORY — DX: Supraventricular tachycardia, unspecified: I47.10

## 2015-03-22 HISTORY — DX: Cardiac arrhythmia, unspecified: I49.9

## 2015-03-22 LAB — TSH: TSH: 2.977 u[IU]/mL (ref 0.350–4.500)

## 2015-03-22 LAB — BASIC METABOLIC PANEL
Anion gap: 9 (ref 5–15)
BUN: 15 mg/dL (ref 6–20)
CO2: 28 mmol/L (ref 22–32)
CREATININE: 1.21 mg/dL (ref 0.61–1.24)
Calcium: 9 mg/dL (ref 8.9–10.3)
Chloride: 102 mmol/L (ref 101–111)
Glucose, Bld: 95 mg/dL (ref 65–99)
POTASSIUM: 3.7 mmol/L (ref 3.5–5.1)
SODIUM: 139 mmol/L (ref 135–145)

## 2015-03-22 LAB — CBC WITH DIFFERENTIAL/PLATELET
BASOS ABS: 0.1 10*3/uL (ref 0–0.1)
Basophils Relative: 1 %
EOS ABS: 0.2 10*3/uL (ref 0–0.7)
EOS PCT: 2 %
HCT: 50.6 % (ref 40.0–52.0)
Hemoglobin: 17.1 g/dL (ref 13.0–18.0)
Lymphocytes Relative: 39 %
Lymphs Abs: 4.6 10*3/uL — ABNORMAL HIGH (ref 1.0–3.6)
MCH: 29.7 pg (ref 26.0–34.0)
MCHC: 33.7 g/dL (ref 32.0–36.0)
MCV: 88.1 fL (ref 80.0–100.0)
Monocytes Absolute: 1.1 10*3/uL — ABNORMAL HIGH (ref 0.2–1.0)
Monocytes Relative: 10 %
NEUTROS PCT: 48 %
Neutro Abs: 5.8 10*3/uL (ref 1.4–6.5)
PLATELETS: 271 10*3/uL (ref 150–440)
RBC: 5.74 MIL/uL (ref 4.40–5.90)
RDW: 13.2 % (ref 11.5–14.5)
WBC: 11.8 10*3/uL — AB (ref 3.8–10.6)

## 2015-03-22 LAB — TROPONIN I

## 2015-03-22 LAB — MAGNESIUM: MAGNESIUM: 2.3 mg/dL (ref 1.7–2.4)

## 2015-03-22 MED ORDER — ADENOSINE 12 MG/4ML IV SOLN
12.0000 mg | Freq: Once | INTRAVENOUS | Status: AC
  Administered 2015-03-22: 12 mg via INTRAVENOUS

## 2015-03-22 MED ORDER — ADENOSINE 6 MG/2ML IV SOLN
INTRAVENOUS | Status: DC
Start: 2015-03-22 — End: 2015-03-22
  Filled 2015-03-22: qty 2

## 2015-03-22 MED ORDER — ADENOSINE 12 MG/4ML IV SOLN
INTRAVENOUS | Status: AC
Start: 2015-03-22 — End: 2015-03-22
  Administered 2015-03-22: 12 mg via INTRAVENOUS
  Filled 2015-03-22: qty 4

## 2015-03-22 MED ORDER — METOPROLOL TARTRATE 1 MG/ML IV SOLN
INTRAVENOUS | Status: AC
  Administered 2015-03-22: 5 mg via INTRAVENOUS
  Filled 2015-03-22: qty 5

## 2015-03-22 MED ORDER — METOPROLOL TARTRATE 1 MG/ML IV SOLN
5.0000 mg | Freq: Once | INTRAVENOUS | Status: AC
  Administered 2015-03-22: 5 mg via INTRAVENOUS

## 2015-03-22 NOTE — Discharge Instructions (Signed)
Paroxysmal Supraventricular Tachycardia  Paroxysmal supraventricular tachycardia (PSVT) is a type of abnormal heart rhythm. It causes your heart to beat very quickly and then suddenly stop beating so quickly. A normal heart rate is 60-100 beats per minute. During an episode of PSVT, your heart rate may be 150-250 beats per minute. This can make you feel light-headed and short of breath. An episode of PSVT can be frightening. It is usually not dangerous.  The heart has four chambers. All chambers need to work together for the heart to beat effectively. A normal heartbeat usually starts in the right upper chamber of the heart (atrium) when an area (sinoatrial node) puts out an electrical signal that spreads to the other chambers. People with PSVT may have abnormal electrical pathways, or they may have other areas in the upper chambers that send out electrical signals. The result is a very rapid heartbeat.  When your heart beats very quickly, it does not have time to fill completely with blood. When PSVT happens often or it lasts for long periods, it can lead to heart weakness and failure. Most people with PSVT do not have any other heart disease.  CAUSES  Abnormal electrical activity in the heart causes PSVT. It is not known why some people get PSVT and others do not.  RISK FACTORS  You may be more likely to have PSVT if:   You are 20-30 years old.   You are a woman.  Other factors that may increase your chances of an attack include:   Stress.   Being tired.   Smoking.   Stimulant drugs.   Alcoholic drinks.   Caffeine.   Pregnancy.  SIGNS AND SYMPTOMS  A mild episode of PSVT may cause no symptoms. If you do have signs and symptoms, they may include:   A pounding heart.   Feeling of skipped heartbeats (palpitations).   Weakness.   Shortness of breath.   Tightness or pain in your chest.   Light-headedness.   Anxiety.   Dizziness.   Sweating.   Nausea.   A fainting spell.  DIAGNOSIS  Your health care  provider may suspect PSVT if you have symptoms that come and go. The health care provider will do a physical exam. If you are having an episode during the exam, the health care provider may be able to diagnose PSVT by listening to your heart and feeling your pulse. Tests may also be done, including:   An electrical study of your heart (electrocardiogram, or ECG).   A test in which you wear a portable ECG monitor all day (Holter monitor) or for several days (event monitor).   A test that involves taking an image of your heart using sound waves (echocardiogram) to rule out other causes of a fast heart rate.  TREATMENT  You may not need treatment if episodes of PSVT do not happen often or if they do not cause symptoms. If PSVT episodes do cause symptoms, your health care provider may first suggest trying a self-treatment called vagus nerve stimulation. The vagus nerve extends down from the brain. It regulates certain body functions. Stimulating this nerve can slow down the heart. Your health care provider can teach you ways to do this. You may need to try a few ways to find what works best for you. Options include:   Holding your breath and pushing, as though you are having a bowel movement.   Massaging an area on one side of your neck below your jaw.     Medicines to prevent an attack. °· Being treated in the hospital with medicine or electric shock to stop an attack (cardioversion). This treatment can include: °¨ Getting medicine through an IV line. °¨ Having a small electric shock delivered to your heart. You will be given medicine to make you sleep through this procedure. °· If you have frequent episodes with symptoms, you may need a procedure to get rid of the faulty  areas of your heart (radiofrequency ablation) and end the episodes of PSVT. In this procedure: °¨ A long, thin tube (catheter) is passed through one of your veins into your heart. °¨ Energy directed through the catheter eliminates the areas of your heart that are causing abnormal electric stimulation. °HOME CARE INSTRUCTIONS °· Take medicines only as directed by your health care provider. °· Do not use caffeine in any form if caffeine triggers episodes of PSVT. Otherwise, consume caffeine in moderation. This means no more than a few cups of coffee or the equivalent each day. °· Do not drink alcohol if alcohol triggers episodes of PSVT. Otherwise, limit alcohol intake to no more than 1 drink per day for nonpregnant women and 2 drinks per day for men. One drink equals 12 ounces of beer, 5 ounces of wine, or 1½ ounces of hard liquor. °· Do not use any tobacco products, including cigarettes, chewing tobacco, or electronic cigarettes. If you need help quitting, ask your health care provider. °· Try to get at least 7 hours of sleep each night. °· Find healthy ways to manage stress. °· Perform vagus nerve stimulation as directed by your health care provider. °· Maintain a healthy weight. °· Get some exercise on most days. Ask your health care provider to suggest some good activities for you. °SEEK MEDICAL CARE IF: °· You are having episodes of PSVT more often, or they are lasting longer. °· Vagus nerve stimulation is no longer helping. °· You have new symptoms during an episode. °SEEK IMMEDIATE MEDICAL CARE IF: °· You have chest pain or trouble breathing. °· You have an episode of PSVT that has lasted longer than 20 minutes. °· You have passed out from an episode of PSVT. °These symptoms may represent a serious problem that is an emergency. Do not wait to see if the symptoms will go away. Get medical help right away. Call your local emergency services (911 in the [place] Do not drive yourself to the hospital. °  °This  information is not intended to replace advice given to you by your health care provider. Make sure you discuss any questions you have with your health care provider. °  °Document Released: 02/02/2005 Document Revised: 02/23/2014 Document Reviewed: 07/13/2013 °Elsevier Interactive Patient Education ©2016 Elsevier Inc. ° °Please return immediately if condition worsens. Please contact her primary physician or the physician you were given for referral. If you have any specialist physicians involved in her treatment and plan please also contact them. Thank you for using [facility]. ° °

## 2015-03-22 NOTE — ED Notes (Signed)
Pt arrives to ED via POV c/o chest pain that started 1 hour PTA. Pt states he was driving and became SOB and blurry vision. Pt hx of SVT, last time was 1 year ago. Pt states that he usually has irregular heart beat. Pt appears flushed, anxious. RR even.

## 2015-03-22 NOTE — ED Provider Notes (Signed)
Time Seen: Approximately 1120 I have reviewed the triage notes  Chief Complaint: Chest Pain   History of Present Illness: Collin Mitchell is a 45 y.o. male who presents with a feeling of a fast heart rate starting approximately one hour prior to arrival. Patient has a history of supraventricular tachycardia and has had chest discomfort associated with it in the past patient states he's had very similar pain today with his heart rate on arrival of 211. He states he has seen a local cardiologist and is not currently on antiarrhythmic medication. He states he normally does well at home and describes some associated shortness of breath and blurry vision. He denies any nausea, vomiting, arm or dry pain. He has no history of cardiovascular disease  Past Medical History  Diagnosis Date  . Irregular heart beat   . SVT (supraventricular tachycardia) (HCC)    6 previous episodes There are no active problems to display for this patient.   History reviewed. No pertinent past surgical history.  History reviewed. No pertinent past surgical history.  No current outpatient prescriptions on file.  Allergies:  Penicillins  Family History: History reviewed. No pertinent family history.  Social History: Social History  Substance Use Topics  . Smoking status: None  . Smokeless tobacco: None  . Alcohol Use: None     Review of Systems:   10 point review of systems was performed and was otherwise negative:  Constitutional: No fever Eyes: No visual disturbances ENT: No sore throat, ear pain Cardiac: Mild substernal heart pounding discomfort Respiratory: No shortness of breath, wheezing, or stridor Abdomen: No abdominal pain, no vomiting, No diarrhea Endocrine: No weight loss, No night sweats Extremities: No peripheral edema, cyanosis Skin: No rashes, easy bruising Neurologic: No focal weakness, trouble with speech or swollowing Urologic: No dysuria, Hematuria, or urinary  frequency   Physical Exam:  ED Triage Vitals  Enc Vitals Group     BP 03/22/15 1124 124/65 mmHg     Pulse Rate 03/22/15 1124 211     Resp 03/22/15 1124 18     Temp 03/22/15 1124 97.5 F (36.4 C)     Temp Source 03/22/15 1124 Oral     SpO2 03/22/15 1124 100 %     Weight --      Height --      Head Cir --      Peak Flow --      Pain Score --      Pain Loc --      Pain Edu? --      Excl. in GC? --     General: Awake , Alert , and Oriented times 3; GCS 15 Head: Normal cephalic , atraumatic Eyes: Pupils equal , round, reactive to light Nose/Throat: No nasal drainage, patent upper airway without erythema or exudate.  Neck: Supple, Full range of motion, No anterior adenopathy or palpable thyroid masses Lungs: Clear to ascultation without wheezes , rhonchi, or rales Heart: Irregular rate, regular rhythm without murmurs gallops or rubs Abdomen: Soft, non tender without rebound, guarding , or rigidity; bowel sounds positive and symmetric in all 4 quadrants. No organomegaly .        Extremities: 2 plus symmetric pulses. No edema, clubbing or cyanosis Neurologic: normal ambulation, Motor symmetric without deficits, sensory intact Skin: warm, dry, no rashes   Labs:   All laboratory work was reviewed including any pertinent negatives or positives listed below:  Labs Reviewed  CBC WITH DIFFERENTIAL/PLATELET - Abnormal; Notable for  the following:    WBC 11.8 (*)    Lymphs Abs 4.6 (*)    Monocytes Absolute 1.1 (*)    All other components within normal limits  BASIC METABOLIC PANEL  MAGNESIUM  TROPONIN I  TSH    EKG:  EKG #1 ED ECG REPORT I, Jennye Moccasin, the attending physician, personally viewed and interpreted this ECG.  Date: 03/22/2015 EKG Time: 1116 Rate: 211 Rhythm: Narrow complex tachycardia with a rate-dependent nonspecific ST-T wave abnormality QRS Axis: normal Intervals: normal ST/T Wave abnormalities: normal Conduction Disturbances: none Narrative  Interpretation: unremarkable  EKG #2 ED ECG REPORT I, Jennye Moccasin, the attending physician, personally viewed and interpreted this ECG.  Date: 03/22/2015 EKG Time: 1133 Rate: 107 Rhythm: Sinus tachycardia QRS Axis: normal Intervals: normal ST/T Wave abnormalities: Nonspecific ST-T wave changes Conduction Disturbances: none Narrative Interpretation: unremarkable No acute ischemic changes noted Radiology:   DG Chest 2 View (Final result) Result time: 03/22/15 12:16:21   Final result by Rad Results In Interface (03/22/15 12:16:21)   Narrative:   CLINICAL DATA: Tachycardia.  EXAM: CHEST 2 VIEW  COMPARISON: January 23, 2013.  FINDINGS: The heart size and mediastinal contours are within normal limits. Both lungs are clear. No pneumothorax or pleural effusion is noted. Anterior osteophyte formation is noted in mid thoracic spine.  IMPRESSION: No active cardiopulmonary disease.   Electronically Signed By: Lupita Raider, M.D. On: 03/22/2015 12:16     I personally reviewed the radiologic studies    Critical Care:  CRITICAL CARE Performed by: Jennye Moccasin   Total critical care time: 33 minutes  Critical care time was exclusive of separately billable procedures and treating other patients.  Critical care was necessary to treat or prevent imminent or life-threatening deterioration.  Critical care was time spent personally by me on the following activities: development of treatment plan with patient and/or surrogate as well as nursing, discussions with consultants, evaluation of patient's response to treatment, examination of patient, obtaining history from patient or surrogate, ordering and performing treatments and interventions, ordering and review of laboratory studies, ordering and review of radiographic studies, pulse oximetry and re-evaluation of patient's condition. Critical care was mainly in the administration of antiarrhythmic medication with  observation    ED Course:  Patient was placed in a bed with continuous cardiac monitoring. The patient has had a dental cart in the past and side effects were reviewed with him. Code cart was ready to go. Patient received a dental cart 12 mg IV 1 with expected positive and in return of a sinus tachycardia. The patient received Lopressor 5 mg IV Patient was observed on a cardiac monitor for approximately an hour and remained in normal sinus rhythm without any other ectopy.   Assessment: * Paroxysmal supraventricular tachycardia   Final Clinical Impression:   Final diagnoses:  SVT (supraventricular tachycardia) (HCC)     Plan:  Outpatient management Patient was advised to return immediately if condition worsens. Patient was advised to follow up with their primary care physician or other specialized physicians involved in their outpatient care             Jennye Moccasin, MD 03/22/15 1529

## 2015-03-25 MED FILL — Medication: Qty: 1 | Status: AC

## 2015-04-11 ENCOUNTER — Encounter: Payer: Self-pay | Admitting: Emergency Medicine

## 2015-04-11 ENCOUNTER — Emergency Department
Admission: EM | Admit: 2015-04-11 | Discharge: 2015-04-11 | Disposition: A | Discharge status: Home / Self Care | Payer: Self-pay | Attending: Emergency Medicine | Admitting: Emergency Medicine

## 2015-04-11 ENCOUNTER — Emergency Department: Payer: Self-pay

## 2015-04-11 DIAGNOSIS — Z880 Allergy status to penicillin: Secondary | ICD-10-CM | POA: Insufficient documentation

## 2015-04-11 DIAGNOSIS — J0100 Acute maxillary sinusitis, unspecified: Secondary | ICD-10-CM | POA: Insufficient documentation

## 2015-04-11 MED ORDER — SULFAMETHOXAZOLE-TRIMETHOPRIM 800-160 MG PO TABS: 1.0000 | ORAL_TABLET | Freq: Two times a day (BID) | ORAL | Status: DC

## 2015-04-11 MED ORDER — HYDROCOD POLST-CPM POLST ER 10-8 MG/5ML PO SUER: 5.0000 mL | Freq: Two times a day (BID) | ORAL | Status: DC

## 2015-04-11 MED ORDER — FEXOFENADINE-PSEUDOEPHED ER 60-120 MG PO TB12: 1.0000 | ORAL_TABLET | Freq: Two times a day (BID) | ORAL | Status: DC

## 2015-04-11 NOTE — Discharge Instructions (Signed)
Sinusitis, Adult  Sinusitis is redness, soreness, and puffiness (inflammation) of the air pockets in the bones of your face (sinuses). The redness, soreness, and puffiness can cause air and mucus to get trapped in your sinuses. This can allow germs to grow and cause an infection.   HOME CARE    Drink enough fluids to keep your pee (urine) clear or pale yellow.   Use a humidifier in your home.   Run a hot shower to create steam in the bathroom. Sit in the bathroom with the door closed. Breathe in the steam 3-4 times a day.   Put a warm, moist washcloth on your face 3-4 times a day, or as told by your doctor.   Use salt water sprays (saline sprays) to wet the thick fluid in your nose. This can help the sinuses drain.   Only take medicine as told by your doctor.  GET HELP RIGHT AWAY IF:    Your pain gets worse.   You have very bad headaches.   You are sick to your stomach (nauseous).   You throw up (vomit).   You are very sleepy (drowsy) all the time.   Your face is puffy (swollen).   Your vision changes.   You have a stiff neck.   You have trouble breathing.  MAKE SURE YOU:    Understand these instructions.   Will watch your condition.   Will get help right away if you are not doing well or get worse.     This information is not intended to replace advice given to you by your health care provider. Make sure you discuss any questions you have with your health care provider.     Document Released: 07/22/2007 Document Revised: 02/23/2014 Document Reviewed: 09/08/2011  Elsevier Interactive Patient Education 2016 Elsevier Inc.

## 2015-04-11 NOTE — ED Notes (Signed)
States he is having sinus pressure and pain with cough  Sx's started 1 week ago

## 2015-04-11 NOTE — ED Notes (Signed)
Sinus congestion, pressure, cough x 1 week

## 2015-04-11 NOTE — ED Provider Notes (Signed)
[facility] Emergency Department Provider Note  ____________________________________________  Time seen: Approximately 1:50 PM  I have reviewed the triage vital signs and the nursing notes.   HISTORY  Chief Complaint Recurrent Sinusitis    HPI Collin Mitchell is a 45 y.o. male who presents with cough and possible sinus infection. He has had the cough for at least 3 weeks and sinus infection for over 1 week. The cough has associated chest congestion and green sputum production. Cough syrup did not help the cough but Motrin did help his fever. He describes the sinus pain as aching behind his eyes and nose which is worse with lying down. He reports that lying down makes him feel nauseous and he has vomited 3 times. He has associated chills, rhinorrhea, dyspnea particularly with eating and dyspnea on exertion. He denies sore throat, stiff neck, dysphagia, and diarrhea. Symptoms are worse at night. He ranks the severity of his symptoms to be a 7/10 and denies radiation of pain. The patient has a history of sinus infection one year ago. Immunizations are UTD except flu shot.     Past Medical History  Diagnosis Date  . Irregular heart beat   . SVT (supraventricular tachycardia) (HCC)     There are no active problems to display for this patient.   History reviewed. No pertinent past surgical history.  Current Outpatient Rx  Name  Route  Sig  Dispense  Refill  . chlorpheniramine-HYDROcodone (TUSSIONEX PENNKINETIC ER) 10-8 MG/5ML SUER   Oral   Take 5 mLs by mouth 2 (two) times daily.   115 mL   0   . fexofenadine-pseudoephedrine (ALLEGRA-D) 60-120 MG 12 hr tablet   Oral   Take 1 tablet by mouth 2 (two) times daily.   30 tablet   0   . sulfamethoxazole-trimethoprim (BACTRIM DS,SEPTRA DS) 800-160 MG tablet   Oral   Take 1 tablet by mouth 2 (two) times daily.   20 tablet   0     Allergies Penicillins  History reviewed. No pertinent family  history.  Social History Social History  Substance Use Topics  . Smoking status: Never Smoker   . Smokeless tobacco: None  . Alcohol Use: No    Review of Systems Constitutional: Fever/chills ENT: No sore throat. Cardiovascular: Denies chest pain. Respiratory: Endorses shortness of breath with eating and dyspnea on exertion.  Gastrointestinal: Endorses nausea, no vomiting.  No diarrhea.   Neurological: Negative for headaches, focal weakness or numbness. 10-point ROS otherwise negative.  ____________________________________________   PHYSICAL EXAM:  VITAL SIGNS: ED Triage Vitals  Enc Vitals Group     BP 04/11/15 1320 121/85 mmHg     Pulse Rate 04/11/15 1320 80     Resp 04/11/15 1320 19     Temp 04/11/15 1320 98 F (36.7 C)     Temp Source 04/11/15 1320 Oral     SpO2 04/11/15 1320 95 %     Weight 04/11/15 1320 180 lb (81.647 kg)     Height 04/11/15 1320  (1.753 m)     Head Cir --      Peak Flow --      Pain Score --      Pain Loc --      Pain Edu? --      Excl. in GC? --     Constitutional: Alert and oriented. Well appearing and in no acute distress. Eyes: Conjunctivae are normal.  Head: Atraumatic. Tenderness to palpation and percussion of frontal  and maxillary sinuses Nose: Congestion/rhinnorhea. Mouth/Throat: Mucous membranes are moist.  Oropharynx non-erythematous. Cardiovascular: Normal rate, regular rhythm. Grossly normal heart sounds.   Respiratory: Normal respiratory effort.  No retractions. Lungs CTAB. Gastrointestinal: Soft and nontender. No distention. No abdominal bruits.  Neurologic:  Normal speech and language. No gross focal neurologic deficits are appreciated.  Skin:  Skin is warm, dry and intact. No rash noted. Psychiatric: Mood and affect are normal. Speech and behavior are normal.  ____________________________________________   LABS (all labs ordered are listed, but only abnormal results are displayed)  Labs Reviewed - No data to  display ____________________________________________  EKG   ____________________________________________  RADIOLOGY  No acute finding on chest x-ray. ____________________________________________   PROCEDURES  Procedure(s) performed: None  Critical Care performed: No  ____________________________________________   INITIAL IMPRESSION / ASSESSMENT AND PLAN / ED COURSE  Pertinent labs & imaging results that were available during my care of the patient were reviewed by me and considered in my medical decision making (see chart for details).  Maxillary sinusitis. Patient given prescription for Bactrim, Allegra-D, and Tussionex. Patient advised to follow-up with family doctor if no improvement in 3-5 days. ____________________________________________   FINAL CLINICAL IMPRESSION(S) / ED DIAGNOSES  Final diagnoses:  Acute maxillary sinusitis, recurrence not specified      Joni Reining, PA-C 04/11/15 1431  Joni Reining, PA-C 04/11/15 1433  Arnaldo Natal, MD 04/11/15 1536

## 2016-05-03 ENCOUNTER — Emergency Department
Admission: EM | Admit: 2016-05-03 | Discharge: 2016-05-04 | Disposition: A | Discharge status: Home / Self Care | Payer: No Typology Code available for payment source | Attending: Student in an Organized Health Care Education/Training Program | Admitting: Student in an Organized Health Care Education/Training Program

## 2016-05-03 DIAGNOSIS — R109 Unspecified abdominal pain: Secondary | ICD-10-CM | POA: Diagnosis not present

## 2016-05-03 LAB — COMPREHENSIVE METABOLIC PANEL
ALK PHOS: 96 U/L (ref 38–126)
ALT: 23 U/L (ref 17–63)
ANION GAP: 7 (ref 5–15)
AST: 26 U/L (ref 15–41)
Albumin: 4.4 g/dL (ref 3.5–5.0)
BILIRUBIN TOTAL: 0.7 mg/dL (ref 0.3–1.2)
BUN: 17 mg/dL (ref 6–20)
CALCIUM: 8.7 mg/dL — AB (ref 8.9–10.3)
CO2: 29 mmol/L (ref 22–32)
Chloride: 102 mmol/L (ref 101–111)
Creatinine, Ser: 1.24 mg/dL (ref 0.61–1.24)
Glucose, Bld: 110 mg/dL — ABNORMAL HIGH (ref 65–99)
Potassium: 3.8 mmol/L (ref 3.5–5.1)
Sodium: 138 mmol/L (ref 135–145)
TOTAL PROTEIN: 8 g/dL (ref 6.5–8.1)

## 2016-05-03 LAB — CBC
HEMATOCRIT: 46 % (ref 40.0–52.0)
HEMOGLOBIN: 16 g/dL (ref 13.0–18.0)
MCH: 30.7 pg (ref 26.0–34.0)
MCHC: 34.8 g/dL (ref 32.0–36.0)
MCV: 88.4 fL (ref 80.0–100.0)
Platelets: 249 10*3/uL (ref 150–440)
RBC: 5.21 MIL/uL (ref 4.40–5.90)
RDW: 13.8 % (ref 11.5–14.5)
WBC: 10.5 10*3/uL (ref 3.8–10.6)

## 2016-05-03 LAB — URINALYSIS, ROUTINE W REFLEX MICROSCOPIC
Bilirubin Urine: NEGATIVE
GLUCOSE, UA: NEGATIVE mg/dL
Hgb urine dipstick: NEGATIVE
Ketones, ur: NEGATIVE mg/dL
LEUKOCYTES UA: NEGATIVE
NITRITE: NEGATIVE
PH: 6 (ref 5.0–8.0)
Protein, ur: NEGATIVE mg/dL
SPECIFIC GRAVITY, URINE: 1.032 — AB (ref 1.005–1.030)

## 2016-05-03 NOTE — ED Triage Notes (Signed)
Pt states that he has been having rt flank pain for the past 3 days and getting worse, pt states that his urine is very dark

## 2016-05-04 ENCOUNTER — Emergency Department: Payer: No Typology Code available for payment source

## 2016-05-04 LAB — LIPASE, BLOOD: LIPASE: 12 U/L (ref 11–51)

## 2016-05-04 MED ORDER — NAPROXEN 500 MG PO TABS: 500.0000 mg | ORAL_TABLET | Freq: Two times a day (BID) | ORAL | 0 refills | Status: DC

## 2016-05-04 MED ORDER — CYCLOBENZAPRINE HCL 10 MG PO TABS: 10.0000 mg | ORAL_TABLET | Freq: Three times a day (TID) | ORAL | 0 refills | Status: DC | PRN

## 2016-05-04 MED ORDER — PROMETHAZINE HCL 25 MG PO TABS
12.5000 mg | ORAL_TABLET | Freq: Once | ORAL | Status: AC
  Administered 2016-05-04: 12.5 mg via ORAL
  Filled 2016-05-04: qty 1

## 2016-05-04 MED ORDER — HYDROCODONE-ACETAMINOPHEN 5-325 MG PO TABS
2.0000 | ORAL_TABLET | Freq: Once | ORAL | Status: AC
  Administered 2016-05-04: 2 via ORAL
  Filled 2016-05-04: qty 2

## 2016-05-04 MED ORDER — SODIUM CHLORIDE 0.9 % IV BOLUS (SEPSIS)
1000.0000 mL | Freq: Once | INTRAVENOUS | Status: AC
  Administered 2016-05-04: 1000 mL via INTRAVENOUS

## 2016-05-04 NOTE — Discharge Instructions (Signed)
Please return for any fevers, worsening, pain, chest pain or shortness of breath.  Please drink plenty of fluids.  Follow up with PCP.

## 2016-05-04 NOTE — ED Provider Notes (Signed)
[facility] Emergency Department Provider Note    First MD Initiated Contact with Patient 05/03/16 2359     (approximate)  I have reviewed the triage vital signs and the nursing notes.   HISTORY  Chief Complaint Flank Pain    HPI Collin Mitchell is a 46 y.o. male presents with chief complaint of colicky right flank pain radiating down to his groin for the past 3 days. Presented to the ER today because the symptoms became worse. States that they wax and wane. There is no change with motion. Describes it as an achy pressure in his right flank. Denies any abdominal pain. No measured fevers. Denies any vomiting but has felt nauseated. Denies any history of kidney stones. Does have a history of gallstones but this feels different. He has noted darker urine and states that he thought he saw some blood in his urine earlier today.   Past Medical History:  Diagnosis Date  . Irregular heart beat   . SVT (supraventricular tachycardia) (HCC)    No family history on file. No past surgical history on file. There are no active problems to display for this patient.     Prior to Admission medications   Medication Sig Start Date End Date Taking? Authorizing Provider  chlorpheniramine-HYDROcodone (TUSSIONEX PENNKINETIC ER) 10-8 MG/5ML SUER Take 5 mLs by mouth 2 (two) times daily. 04/11/15   Joni Reining, PA-C  fexofenadine-pseudoephedrine (ALLEGRA-D) 60-120 MG 12 hr tablet Take 1 tablet by mouth 2 (two) times daily. 04/11/15   Joni Reining, PA-C  sulfamethoxazole-trimethoprim (BACTRIM DS,SEPTRA DS) 800-160 MG tablet Take 1 tablet by mouth 2 (two) times daily. 04/11/15   Joni Reining, PA-C    Allergies Penicillins    Social History Social History  Substance Use Topics  . Smoking status: Never Smoker  . Smokeless tobacco: Not on file  . Alcohol use No    Review of Systems Patient denies headaches, rhinorrhea, blurry vision, numbness, shortness of breath,  chest pain, edema, cough, abdominal pain, nausea, vomiting, diarrhea, dysuria, fevers, rashes or hallucinations unless otherwise stated above in HPI. ____________________________________________   PHYSICAL EXAM:  VITAL SIGNS: Vitals:   05/03/16 2130  BP: (!) 165/85  Pulse: 95  Resp: 18  Temp: 98.4 F (36.9 C)    Constitutional: Alert and oriented. Well appearing and in no acute distress. Eyes: Conjunctivae are normal. PERRL. EOMI. Head: Atraumatic. Nose: No congestion/rhinnorhea. Mouth/Throat: Mucous membranes are moist.  Oropharynx non-erythematous. Neck: No stridor. Painless ROM. No cervical spine tenderness to palpation Hematological/Lymphatic/Immunilogical: No cervical lymphadenopathy. Cardiovascular: Normal rate, regular rhythm. Grossly normal heart sounds.  Good peripheral circulation. Respiratory: Normal respiratory effort.  No retractions. Lungs CTAB. Gastrointestinal: Soft and nontender. No distention. No abdominal bruits. + right CVA tenderness.  Musculoskeletal: No lower extremity tenderness nor edema.  No joint effusions. Neurologic:  Normal speech and language. No gross focal neurologic deficits are appreciated. No gait instability. Skin:  Skin is warm, dry and intact. No rash noted. Psychiatric: Mood and affect are normal. Speech and behavior are normal.  ____________________________________________   LABS (all labs ordered are listed, but only abnormal results are displayed)  Results for orders placed or performed during the hospital encounter of 05/03/16 (from the past 24 hour(s))  CBC     Status: None   Collection Time: 05/03/16  9:30 PM  Result Value Ref Range   WBC 10.5 3.8 - 10.6 K/uL   RBC 5.21 4.40 - 5.90 MIL/uL   Hemoglobin 16.0  13.0 - 18.0 g/dL   HCT 82.946.0 56.240.0 - 13.052.0 %   MCV 88.4 80.0 - 100.0 fL   MCH 30.7 26.0 - 34.0 pg   MCHC 34.8 32.0 - 36.0 g/dL   RDW 86.513.8 78.411.5 - 69.614.5 %   Platelets 249 150 - 440 K/uL  Comprehensive metabolic panel      Status: Abnormal   Collection Time: 05/03/16  9:30 PM  Result Value Ref Range   Sodium 138 135 - 145 mmol/L   Potassium 3.8 3.5 - 5.1 mmol/L   Chloride 102 101 - 111 mmol/L   CO2 29 22 - 32 mmol/L   Glucose, Bld 110 (H) 65 - 99 mg/dL   BUN 17 6 - 20 mg/dL   Creatinine, Ser 2.951.24 0.61 - 1.24 mg/dL   Calcium 8.7 (L) 8.9 - 10.3 mg/dL   Total Protein 8.0 6.5 - 8.1 g/dL   Albumin 4.4 3.5 - 5.0 g/dL   AST 26 15 - 41 U/L   ALT 23 17 - 63 U/L   Alkaline Phosphatase 96 38 - 126 U/L   Total Bilirubin 0.7 0.3 - 1.2 mg/dL   GFR calc non Af Amer >60 >60 mL/min   GFR calc Af Amer >60 >60 mL/min   Anion gap 7 5 - 15  Urinalysis, Routine w reflex microscopic     Status: Abnormal   Collection Time: 05/03/16 10:32 PM  Result Value Ref Range   Color, Urine YELLOW (A) YELLOW   APPearance CLEAR (A) CLEAR   Specific Gravity, Urine 1.032 (H) 1.005 - 1.030   pH 6.0 5.0 - 8.0   Glucose, UA NEGATIVE NEGATIVE mg/dL   Hgb urine dipstick NEGATIVE NEGATIVE   Bilirubin Urine NEGATIVE NEGATIVE   Ketones, ur NEGATIVE NEGATIVE mg/dL   Protein, ur NEGATIVE NEGATIVE mg/dL   Nitrite NEGATIVE NEGATIVE   Leukocytes, UA NEGATIVE NEGATIVE   ____________________________________________ ____________________________________________  RADIOLOGY  I personally reviewed all radiographic images ordered to evaluate for the above acute complaints and reviewed radiology reports and findings.  These findings were personally discussed with the patient.  Please see medical record for radiology report.  ____________________________________________   PROCEDURES  Procedure(s) performed:  Procedures    Critical Care performed: no ____________________________________________   INITIAL IMPRESSION / ASSESSMENT AND PLAN / ED COURSE  Pertinent labs & imaging results that were available during my care of the patient were reviewed by me and considered in my medical decision making (see chart for details).  DDX: stone,  cholecystitis, cholelithiasis, pyelo, msk strain  Collin Mitchell is a 46 y.o. who presents to the ED with right flank pain for 3 days. No fevers, no systemic symptoms. - urinary symptoms. Denies trauma or injury. Afebrile in ED. Exam as above. Flank TTP, otherwise abdominal exam is benign. No peritoneal signs. Possible kidney stone, cystitis, or pyelonephritis.   Lung exam reassuring.  No tachycardia or hypoxia.  Patient low risk by wells and PERC negative.    Checking urine. UA with without hematuria or infection CT Stone with no evidence of stone. Clinical picture is not consistent with appendicitis, diverticulitis, pancreatitis, cholecystitis, bowel perforation, aortic dissection, splenic injury or acute abdominal process at this time.  Pain improved, tolerating PO. Repeat ABD exam benign, will plan supportive treatment and early follow up for recheck.       ____________________________________________   FINAL CLINICAL IMPRESSION(S) / ED DIAGNOSES  Final diagnoses:  Right flank pain      NEW MEDICATIONS STARTED DURING THIS VISIT:  New Prescriptions   No medications on file     Note:  This document was prepared using Dragon voice recognition software and may include unintentional dictation errors.    Willy Eddy, MD 05/04/16 [id number]

## 2016-05-04 NOTE — ED Notes (Signed)
Patient transported to CT at this time. 

## 2016-06-24 DIAGNOSIS — T25231A Burn of second degree of right toe(s) (nail), initial encounter: Secondary | ICD-10-CM | POA: Insufficient documentation

## 2016-08-13 ENCOUNTER — Emergency Department
Admission: EM | Admit: 2016-08-13 | Discharge: 2016-08-13 | Disposition: A | Discharge status: Home / Self Care | Payer: No Typology Code available for payment source | Attending: Emergency Medicine | Admitting: Emergency Medicine

## 2016-08-13 ENCOUNTER — Encounter: Payer: Self-pay | Admitting: Emergency Medicine

## 2016-08-13 DIAGNOSIS — J069 Acute upper respiratory infection, unspecified: Secondary | ICD-10-CM | POA: Diagnosis not present

## 2016-08-13 DIAGNOSIS — R05 Cough: Secondary | ICD-10-CM | POA: Diagnosis present

## 2016-08-13 MED ORDER — BENZONATATE 100 MG PO CAPS: ORAL_CAPSULE | ORAL | 0 refills | Status: DC

## 2016-08-13 NOTE — ED Triage Notes (Signed)
Patient presents to ED via POV with c/o cough and runny nose. Patient states, "I think I have a chest cold". Ambulatory to room without difficulty.

## 2016-08-13 NOTE — ED Provider Notes (Signed)
[facility] Emergency Department Provider Note ____________________________________________  Time seen: 11:27 AM  I have reviewed the triage vital signs and the nursing notes.  HISTORY  Chief Complaint  Cough   HPI Collin Mitchell is a 46 y.o. male is here with complaint of cough and congestion for the last 4 days. Patient has been taking ibuprofen or Tylenol as needed for body aches. He is not taking any over-the-counter cough medication. Patient denies any ear pain, throat pain, or body aches. Patient had subjective fever. He continues to eat and drink.    Past Medical History:  Diagnosis Date  . Irregular heart beat   . SVT (supraventricular tachycardia) (HCC)     There are no active problems to display for this patient.   History reviewed. No pertinent surgical history.  Prior to Admission medications   Medication Sig Start Date End Date Taking? Authorizing Provider  benzonatate (TESSALON PERLES) 100 MG capsule Take one or 2 tablets every 8 hours as needed for cough. 08/13/16   Tommi Rumps, PA-C    Allergies Penicillins  No family history on file.  Social History Social History  Substance Use Topics  . Smoking status: Never Smoker  . Smokeless tobacco: Never Used  . Alcohol use No    Review of Systems  Constitutional: Subjectively positive for fever. Eyes: Negative for visual changes. ENT: Negative for sore throat. Positive nasal congestion. Cardiovascular: Negative for chest pain. Respiratory: Negative for shortness of breath. Positive cough. Musculoskeletal: Negative for back pain. Skin: Negative for rash. Neurological: Negative for headaches, focal weakness or numbness. ____________________________________________  PHYSICAL EXAM:  VITAL SIGNS: ED Triage Vitals  Enc Vitals Group     BP 08/13/16 1131 118/73     Pulse Rate 08/13/16 1131 88     Resp 08/13/16 1131 14     Temp 08/13/16 1131 97.8 F (36.6 C)     Temp  Source 08/13/16 1131 Oral     SpO2 08/13/16 1131 97 %     Weight 08/13/16 1123 186 lb (84.4 kg)     Height 08/13/16 1123 5\' 9"  (1.753 m)     Head Circumference --      Peak Flow --      Pain Score --      Pain Loc --      Pain Edu? --      Excl. in GC? --     Constitutional: Alert and oriented. Well appearing and in no distress. Head: Normocephalic and atraumatic. Eyes: Conjunctivae are normal.  Ears: Canals clear. TMs intact bilaterally. Nose: Moderate congestion/rhinorrhea Mouth/Throat: Mucous membranes are moist. Neck: Supple. No thyromegaly. Hematological/Lymphatic/Immunological: No cervical lymphadenopathy. Cardiovascular: Normal rate, regular rhythm. Normal distal pulses. Respiratory: Normal respiratory effort. No wheezes/rales/rhonchi. Musculoskeletal: Moves upper and lower extremities without any difficulty. Neurologic:  Normal gait without ataxia. Normal speech and language. No gross focal neurologic deficits are appreciated. Skin:  Skin is warm, dry and intact. No rash noted. Psychiatric: Mood and affect are normal. Patient exhibits appropriate insight and judgment. _________________________________________  INITIAL IMPRESSION / ASSESSMENT AND PLAN / ED COURSE  Patient will continue taking Tylenol or ibuprofen as needed.  He is encouraged to increase fluids. He will obtain Claritin D or Zyrtec-D over-the-counter for nasal congestion. Tessalon Perles one or 2 every 8 hours as needed for coughing. He will follow-up with his PCP in [facility] if any continued problems.    ____________________________________________  FINAL CLINICAL IMPRESSION(S) / ED DIAGNOSES  Final diagnoses:  Acute upper respiratory infection     Eather ColasSummers, Zeus Marquis L, PA-C 08/13/16 1310    Phineas SemenGoodman, Graydon, MD 08/13/16 [phone]

## 2016-08-13 NOTE — Discharge Instructions (Signed)
Follow-up with your primary care doctor in [place] if any continued problems. He may obtain either Claritin-D or Zyrtec-D over-the-counter for nasal congestion. Tessalon Perles 1 or 2 every 8 hours as needed for coughing. This medication will not cause drowsiness. Increase fluids. You may also obtain saline nose spray to use as needed for congestion.

## 2017-04-27 DIAGNOSIS — K2270 Barrett's esophagus without dysplasia: Secondary | ICD-10-CM | POA: Insufficient documentation

## 2017-08-16 DIAGNOSIS — R519 Headache, unspecified: Secondary | ICD-10-CM | POA: Insufficient documentation

## 2018-02-06 ENCOUNTER — Encounter: Payer: Self-pay | Admitting: Emergency Medicine

## 2018-02-06 ENCOUNTER — Other Ambulatory Visit: Payer: Self-pay

## 2018-02-06 ENCOUNTER — Emergency Department
Admission: EM | Admit: 2018-02-06 | Discharge: 2018-02-06 | Disposition: A | Discharge status: Home / Self Care | Payer: No Typology Code available for payment source | Attending: Emergency Medicine | Admitting: Emergency Medicine

## 2018-02-06 DIAGNOSIS — R112 Nausea with vomiting, unspecified: Secondary | ICD-10-CM | POA: Diagnosis not present

## 2018-02-06 DIAGNOSIS — B349 Viral infection, unspecified: Secondary | ICD-10-CM

## 2018-02-06 DIAGNOSIS — M7918 Myalgia, other site: Secondary | ICD-10-CM | POA: Diagnosis not present

## 2018-02-06 DIAGNOSIS — R1110 Vomiting, unspecified: Secondary | ICD-10-CM | POA: Diagnosis present

## 2018-02-06 LAB — URINALYSIS, COMPLETE (UACMP) WITH MICROSCOPIC
BILIRUBIN URINE: NEGATIVE
Bacteria, UA: NONE SEEN
Glucose, UA: NEGATIVE mg/dL
HGB URINE DIPSTICK: NEGATIVE
Ketones, ur: NEGATIVE mg/dL
LEUKOCYTES UA: NEGATIVE
NITRITE: NEGATIVE
PROTEIN: NEGATIVE mg/dL
Specific Gravity, Urine: 1.025 (ref 1.005–1.030)
Squamous Epithelial / LPF: NONE SEEN (ref 0–5)
pH: 5 (ref 5.0–8.0)

## 2018-02-06 LAB — CBC WITH DIFFERENTIAL/PLATELET
ABS IMMATURE GRANULOCYTES: 0.05 10*3/uL (ref 0.00–0.07)
Basophils Absolute: 0 10*3/uL (ref 0.0–0.1)
Basophils Relative: 0 %
Eosinophils Absolute: 0.1 10*3/uL (ref 0.0–0.5)
Eosinophils Relative: 1 %
HCT: 48.3 % (ref 39.0–52.0)
HEMOGLOBIN: 16.1 g/dL (ref 13.0–17.0)
IMMATURE GRANULOCYTES: 0 %
LYMPHS ABS: 0.9 10*3/uL (ref 0.7–4.0)
LYMPHS PCT: 6 %
MCH: 29.9 pg (ref 26.0–34.0)
MCHC: 33.3 g/dL (ref 30.0–36.0)
MCV: 89.8 fL (ref 80.0–100.0)
Monocytes Absolute: 0.5 10*3/uL (ref 0.1–1.0)
Monocytes Relative: 3 %
NEUTROS ABS: 13.6 10*3/uL — AB (ref 1.7–7.7)
Neutrophils Relative %: 90 %
Platelets: 251 10*3/uL (ref 150–400)
RBC: 5.38 MIL/uL (ref 4.22–5.81)
RDW: 12.5 % (ref 11.5–15.5)
WBC: 15.1 10*3/uL — AB (ref 4.0–10.5)
nRBC: 0 % (ref 0.0–0.2)

## 2018-02-06 LAB — COMPREHENSIVE METABOLIC PANEL
ALT: 17 U/L (ref 0–44)
AST: 22 U/L (ref 15–41)
Albumin: 4.4 g/dL (ref 3.5–5.0)
Alkaline Phosphatase: 91 U/L (ref 38–126)
Anion gap: 8 (ref 5–15)
BUN: 20 mg/dL (ref 6–20)
CHLORIDE: 102 mmol/L (ref 98–111)
CO2: 27 mmol/L (ref 22–32)
Calcium: 8.3 mg/dL — ABNORMAL LOW (ref 8.9–10.3)
Creatinine, Ser: 1.05 mg/dL (ref 0.61–1.24)
Glucose, Bld: 118 mg/dL — ABNORMAL HIGH (ref 70–99)
POTASSIUM: 3.8 mmol/L (ref 3.5–5.1)
SODIUM: 137 mmol/L (ref 135–145)
Total Bilirubin: 0.9 mg/dL (ref 0.3–1.2)
Total Protein: 7.5 g/dL (ref 6.5–8.1)

## 2018-02-06 MED ORDER — SODIUM CHLORIDE 0.9 % IV BOLUS
1000.0000 mL | Freq: Once | INTRAVENOUS | Status: AC
Start: 2018-02-06 — End: 2018-02-06
  Administered 2018-02-06: 1000 mL via INTRAVENOUS

## 2018-02-06 MED ORDER — ONDANSETRON HCL 4 MG/2ML IJ SOLN
4.0000 mg | Freq: Once | INTRAMUSCULAR | Status: AC
Start: 2018-02-06 — End: 2018-02-06
  Administered 2018-02-06: 4 mg via INTRAVENOUS
  Filled 2018-02-06: qty 2

## 2018-02-06 MED ORDER — ONDANSETRON 4 MG PO TBDP: 4.0000 mg | ORAL_TABLET | Freq: Three times a day (TID) | ORAL | 0 refills | Status: DC | PRN

## 2018-02-06 NOTE — Discharge Instructions (Signed)
Follow up with primary care if not improving over the next few days.  If you develop abdominal pain and have other symptoms of concern return to the ER.

## 2018-02-06 NOTE — ED Notes (Signed)
See triage note  Presents with cough and subjective fever for couple of days afebrile on arrival   Some body aches

## 2018-02-06 NOTE — ED Triage Notes (Addendum)
Fever and cough began today.

## 2018-02-06 NOTE — ED Provider Notes (Signed)
[facility] Emergency Department Provider Note  ____________________________________________  Time seen: Approximately 7:49 PM  I have reviewed the triage vital signs and the nursing notes.   HISTORY  Chief Complaint Fever and Cough   HPI Collin Mitchell is a 47 y.o. adult who presents to the emergency department for treatment and evaluation of vomiting x 6-7 with body aches and chills. Cough only prior to vomiting. He denies abdominal pain or diarrhea. Symptoms started suddenly this afternoon. No alleviating measures prior to arrival.    Past Medical History:  Diagnosis Date  . Irregular heart beat   . SVT (supraventricular tachycardia) (HCC)     There are no active problems to display for this patient.   History reviewed. No pertinent surgical history.  Prior to Admission medications   Medication Sig Start Date End Date Taking? Authorizing Provider  benzonatate (TESSALON PERLES) 100 MG capsule Take one or 2 tablets every 8 hours as needed for cough. 08/13/16   Tommi RumpsSummers, Rhonda L, PA-C  ondansetron (ZOFRAN-ODT) 4 MG disintegrating tablet Take 1 tablet (4 mg total) by mouth every 8 (eight) hours as needed for nausea or vomiting. 02/06/18   Kem Boroughsriplett, Ardena Gangl B, FNP    Allergies Penicillins  No family history on file.  Social History Social History   Tobacco Use  . Smoking status: Never Smoker  . Smokeless tobacco: Never Used  Substance Use Topics  . Alcohol use: No  . Drug use: No    Review of Systems Constitutional: Positive fever/chills.  No appetite. ENT: Negative for sore throat. Cardiovascular: Denies chest pain. Respiratory: Negative for shortness of breath.  Negative for cough.  Negative for wheezing.  Gastrointestinal: Positive for nausea, positive for vomiting.  Negative for diarrhea.  Musculoskeletal: Positive for body aches Skin: Negative for rash. Neurological: Negative for  headaches ____________________________________________   PHYSICAL EXAM:  VITAL SIGNS: ED Triage Vitals  Enc Vitals Group     BP 02/06/18 1756 131/79     Pulse Rate 02/06/18 1756 98     Resp 02/06/18 1756 20     Temp 02/06/18 1756 98.2 F (36.8 C)     Temp Source 02/06/18 1756 Oral     SpO2 02/06/18 1756 100 %     Weight 02/06/18 1757 178 lb (80.7 kg)     Height 02/06/18 1757 5\' 9"  (1.753 m)     Head Circumference --      Peak Flow --      Pain Score 02/06/18 1757 0     Pain Loc --      Pain Edu? --      Excl. in GC? --     Constitutional: Alert and oriented.  Acutely ill appearing and in no acute distress. Eyes: Conjunctivae are normal. Ears: Exam deferred Nose: No sinus congestion noted; no rhinnorhea. Mouth/Throat: Mucous membranes are moist.  Oropharynx clear. Tonsils flat. Uvula midline. Neck: No stridor.  Lymphatic: No cervical lymphadenopathy. Cardiovascular: Normal rate, regular rhythm. Good peripheral circulation. Respiratory: Respirations are even and unlabored.  No retractions.  Breath sounds clear to auscultation. Gastrointestinal: Soft and nontender.  Hyperactive bowel sounds x4 quadrants. Musculoskeletal: FROM x 4 extremities.  Neurologic:  Normal speech and language. Skin:  Skin is warm, dry and intact. No rash noted. Psychiatric: Mood and affect are normal. Speech and behavior are normal.  ____________________________________________   LABS (all labs ordered are listed, but only abnormal results are displayed)  Labs Reviewed  COMPREHENSIVE METABOLIC PANEL - Abnormal; Notable for the  following components:      Result Value   Glucose, Bld 118 (*)    Calcium 8.3 (*)    All other components within normal limits  CBC WITH DIFFERENTIAL/PLATELET - Abnormal; Notable for the following components:   WBC 15.1 (*)    Neutro Abs 13.6 (*)    All other components within normal limits  URINALYSIS, COMPLETE (UACMP) WITH MICROSCOPIC - Abnormal; Notable for the  following components:   Color, Urine YELLOW (*)    APPearance CLEAR (*)    All other components within normal limits   ____________________________________________  EKG  None indicated ____________________________________________  RADIOLOGY  Not indicated ____________________________________________   PROCEDURES  Procedure(s) performed: None  Critical Care performed: No ____________________________________________   INITIAL IMPRESSION / ASSESSMENT AND PLAN / ED COURSE  47 y.o. adult male presenting to the emergency department for treatment of nausea, vomiting, body aches, and chills.  Symptoms started today while he was at work.  He has been unable to tolerate any food or fluids.  He has no abdominal or chest pain.  Symptoms most likely related to a viral gastro-intestinal syndrome as it is prevalent in the community at this time.  IV fluids, Zofran, and labs to be administered.  ----------------------------------------- 7:54 PM on 02/06/2018 -----------------------------------------  No further vomiting after fluids were started and Zofran was given.  White blood cell count noted to be slightly elevated, and the urine is still pending.  Patient was encouraged to provide a specimen soon as possible.  At this time, the patient is resting on the stretcher with his eyes closed. He continues to deny pain.  ----------------------------------------- 9:21 PM on 02/06/2018 -----------------------------------------  No further vomiting. Patient feels better. He is to follow up with his PCP for repeat labs and/or symptoms that do not improve over the next few days. He is to return to the ER for changes if unable to schedule an appointment.   Medications  sodium chloride 0.9 % bolus 1,000 mL (0 mLs Intravenous Stopped 02/06/18 2045)  ondansetron (ZOFRAN) injection 4 mg (4 mg Intravenous Given 02/06/18 1923)    ED Discharge Orders         Ordered    ondansetron (ZOFRAN-ODT) 4 MG  disintegrating tablet  Every 8 hours PRN     02/06/18 2123           Pertinent labs & imaging results that were available during my care of the patient were reviewed by me and considered in my medical decision making (see chart for details).    If controlled substance prescribed during this visit, 12 month history viewed on the [facility] prior to issuing an initial prescription for Schedule II or III opiod. ____________________________________________   FINAL CLINICAL IMPRESSION(S) / ED DIAGNOSES  Final diagnoses:  Viral syndrome  Intractable vomiting with nausea, unspecified vomiting type    Note:  This document was prepared using Dragon voice recognition software and may include unintentional dictation errors.     Chinita Pesterriplett, Arlicia Paquette B, FNP 02/06/18 2227    Emily FilbertWilliams, Jonathan E, MD 02/06/18 [phone]

## 2018-03-13 ENCOUNTER — Emergency Department
Admission: EM | Admit: 2018-03-13 | Discharge: 2018-03-13 | Disposition: A | Discharge status: Home / Self Care | Payer: 59 | Attending: Emergency Medicine | Admitting: Emergency Medicine

## 2018-03-13 ENCOUNTER — Other Ambulatory Visit: Payer: Self-pay

## 2018-03-13 DIAGNOSIS — B029 Zoster without complications: Secondary | ICD-10-CM | POA: Insufficient documentation

## 2018-03-13 DIAGNOSIS — R21 Rash and other nonspecific skin eruption: Secondary | ICD-10-CM | POA: Insufficient documentation

## 2018-03-13 DIAGNOSIS — I10 Essential (primary) hypertension: Secondary | ICD-10-CM | POA: Insufficient documentation

## 2018-03-13 HISTORY — DX: Pure hypercholesterolemia, unspecified: E78.00

## 2018-03-13 HISTORY — DX: Essential (primary) hypertension: I10

## 2018-03-13 MED ORDER — ACYCLOVIR 200 MG PO CAPS
800.0000 mg | ORAL_CAPSULE | Freq: Once | ORAL | Status: AC
  Administered 2018-03-13: 800 mg via ORAL
  Filled 2018-03-13: qty 4

## 2018-03-13 MED ORDER — PREDNISONE 10 MG PO TABS: 10.0000 mg | ORAL_TABLET | Freq: Every day | ORAL | 0 refills | Status: DC

## 2018-03-13 MED ORDER — ACYCLOVIR 400 MG PO TABS: 800.0000 mg | ORAL_TABLET | Freq: Every day | ORAL | 0 refills | Status: AC

## 2018-03-13 MED ORDER — HYDROCODONE-ACETAMINOPHEN 5-325 MG PO TABS
1.0000 | ORAL_TABLET | ORAL | Status: AC
  Administered 2018-03-13: 1 via ORAL
  Filled 2018-03-13: qty 1

## 2018-03-13 MED ORDER — TRAMADOL HCL 50 MG PO TABS: 50.0000 mg | ORAL_TABLET | Freq: Four times a day (QID) | ORAL | 0 refills | Status: AC | PRN

## 2018-03-13 MED ORDER — PREDNISONE 20 MG PO TABS
60.0000 mg | ORAL_TABLET | Freq: Once | ORAL | Status: AC
  Administered 2018-03-13: 60 mg via ORAL
  Filled 2018-03-13: qty 3

## 2018-03-13 NOTE — Discharge Instructions (Signed)
Please take medications as prescribed if any worsening rash, pain, fevers or rash develops on your face or near your eyes return to the emergency department.

## 2018-03-13 NOTE — ED Notes (Signed)
No peripheral IV placed this visit.   Discharge instructions reviewed with patient. Questions fielded by this RN. Patient verbalizes understanding of instructions. Patient discharged home in stable condition per chris gaines. No acute distress noted at time of discharge.

## 2018-03-13 NOTE — ED Notes (Addendum)
ED Provider at bedside.  Pt reports stinging, burning and itching to right hand that started yes, base of right thumb with approx 4 indurations perpendicular to thumb, pt reports oozing earlier,

## 2018-03-13 NOTE — ED Provider Notes (Signed)
[facility] EMERGENCY DEPARTMENT Provider Note   CSN: [id number] Arrival date & time: 03/13/18  2036     History   Chief Complaint Chief Complaint  Patient presents with  . Insect Bite    HPI Collin Mitchell is a 48 y.o. adult.  Presents for evaluation of rash to the right thumb CMC joint for 24 hours.  He denies any trauma or injury.  Describes 3-4 bumps along the dorsal aspect of the right thumb along the [facility].  He notes painful burning vesicles with mild itching.  Pain radiates along the radial aspect of the forearm and into the biceps.  He denies any neck pain, fevers, trauma or injury.  HPI  Past Medical History:  Diagnosis Date  . Hypercholesterolemia   . Hypertension   . Irregular heart beat   . SVT (supraventricular tachycardia) (HCC)     There are no active problems to display for this patient.   Past Surgical History:  Procedure Laterality Date  . CARDIAC ELECTROPHYSIOLOGY MAPPING AND ABLATION          Home Medications    Prior to Admission medications   Medication Sig Start Date End Date Taking? Authorizing Provider  acyclovir (ZOVIRAX) 400 MG tablet Take 2 tablets (800 mg total) by mouth 5 (five) times daily for 10 days. 03/13/18 03/23/18  Evon SlackGaines,  C, PA-C  benzonatate (TESSALON PERLES) 100 MG capsule Take one or 2 tablets every 8 hours as needed for cough. 08/13/16   Tommi RumpsSummers, Rhonda L, PA-C  ondansetron (ZOFRAN-ODT) 4 MG disintegrating tablet Take 1 tablet (4 mg total) by mouth every 8 (eight) hours as needed for nausea or vomiting. 02/06/18   Triplett, Cari B, FNP  predniSONE (DELTASONE) 10 MG tablet Take 1 tablet (10 mg total) by mouth daily. 6,5,4,3,2,1 six day taper 03/13/18   Evon SlackGaines,  C, PA-C  traMADol (ULTRAM) 50 MG tablet Take 1 tablet (50 mg total) by mouth every 6 (six) hours as needed. 03/13/18 03/13/19  Evon SlackGaines,  C, PA-C    Family History No family history on file.  Social History Social History    Tobacco Use  . Smoking status: Never Smoker  . Smokeless tobacco: Never Used  Substance Use Topics  . Alcohol use: No  . Drug use: No     Allergies   Penicillins   Review of Systems Review of Systems  Constitutional: Negative for fever.  Musculoskeletal: Positive for arthralgias. Negative for joint swelling.  Skin: Positive for rash.  Neurological: Negative for weakness and numbness.     Physical Exam Updated Vital Signs BP 127/77 (BP Location: Left Arm)   Pulse 89   Temp 98.2 F (36.8 C) (Oral)   Resp 18   Ht 5\' 9"  (1.753 m)   Wt 84.4 kg   SpO2 98%   BMI 27.47 kg/m   Physical Exam Constitutional:      Appearance: She is well-developed.  HENT:     Head: Normocephalic and atraumatic.  Eyes:     Conjunctiva/sclera: Conjunctivae normal.  Neck:     Musculoskeletal: Normal range of motion.  Cardiovascular:     Rate and Rhythm: Normal rate.  Pulmonary:     Effort: Pulmonary effort is normal. No respiratory distress.  Musculoskeletal: Normal range of motion.     Comments: Examination of the right thumb shows 4 small erythematous papules on the dorsal aspect of the right thumb at the [facility].  There is no swelling warmth or redness.  There  is no drainage.  No fluctuance.  Sensation is intact.  No limited range of motion of the thumb.  Patient minimally tender to palpation.  Skin:    General: Skin is warm.     Findings: No rash.  Neurological:     Mental Status: She is alert and oriented to person, place, and time.  Psychiatric:        Behavior: Behavior normal.        Thought Content: Thought content normal.      ED Treatments / Results  Labs (all labs ordered are listed, but only abnormal results are displayed) Labs Reviewed - No data to display  EKG None  Radiology No results found.  Procedures Procedures (including critical care time)  Medications Ordered in ED Medications  HYDROcodone-acetaminophen (NORCO/VICODIN) 5-325 MG per tablet 1  tablet (has no administration in time range)  acyclovir (ZOVIRAX) 200 MG capsule 800 mg (has no administration in time range)  predniSONE (DELTASONE) tablet 60 mg (has no administration in time range)     Initial Impression / Assessment and Plan / ED Course  I have reviewed the triage vital signs and the nursing notes.  Pertinent labs & imaging results that were available during my care of the patient were reviewed by me and considered in my medical decision making (see chart for details).     24-hour rash to the dorsal aspect right thumb consistent with grouped vesicles possibly shingles.  Patient does appear to have significant pain and burning along the rash which is radiating along the same nerve distribution of the arm.  Due to history and exam findings we will go and treat for shingles.  Patient placed on acyclovir and prednisone.  He is given tramadol for pain.  Final Clinical Impressions(s) / ED Diagnoses   Final diagnoses:  Rash  Herpes zoster without complication    ED Discharge Orders         Ordered    predniSONE (DELTASONE) 10 MG tablet  Daily     03/13/18 2124    acyclovir (ZOVIRAX) 400 MG tablet  5 times daily     03/13/18 2124    traMADol (ULTRAM) 50 MG tablet  Every 6 hours PRN     03/13/18 2124           Ronnette Juniper 03/13/18 2131    Nita Sickle, MD 03/14/18 [phone]

## 2018-03-13 NOTE — ED Triage Notes (Signed)
Pt to the er for swelling to the right hand r/t a possible bug bite to the right thumb. Pt states the areas of swelling appeared last night and swelling has now moved into the hand and is affecting functions. Pt reports itching. Pt took benadryl 1 hour ago.

## 2019-08-23 ENCOUNTER — Other Ambulatory Visit: Payer: Self-pay

## 2019-08-23 ENCOUNTER — Observation Stay (HOSPITAL_COMMUNITY)
Admission: EM | Admit: 2019-08-23 | Discharge: 2019-08-24 | Disposition: A | Discharge status: Home / Self Care | Payer: 59 | Attending: Internal Medicine | Admitting: Internal Medicine

## 2019-08-23 ENCOUNTER — Emergency Department (HOSPITAL_COMMUNITY): Payer: 59

## 2019-08-23 ENCOUNTER — Encounter (HOSPITAL_COMMUNITY): Payer: Self-pay | Admitting: *Deleted

## 2019-08-23 DIAGNOSIS — Z79899 Other long term (current) drug therapy: Secondary | ICD-10-CM | POA: Insufficient documentation

## 2019-08-23 DIAGNOSIS — R739 Hyperglycemia, unspecified: Secondary | ICD-10-CM | POA: Diagnosis present

## 2019-08-23 DIAGNOSIS — R0789 Other chest pain: Principal | ICD-10-CM | POA: Insufficient documentation

## 2019-08-23 DIAGNOSIS — E7800 Pure hypercholesterolemia, unspecified: Secondary | ICD-10-CM | POA: Diagnosis not present

## 2019-08-23 DIAGNOSIS — R079 Chest pain, unspecified: Secondary | ICD-10-CM | POA: Diagnosis present

## 2019-08-23 DIAGNOSIS — Z20822 Contact with and (suspected) exposure to covid-19: Secondary | ICD-10-CM | POA: Diagnosis not present

## 2019-08-23 DIAGNOSIS — R112 Nausea with vomiting, unspecified: Secondary | ICD-10-CM | POA: Insufficient documentation

## 2019-08-23 DIAGNOSIS — I10 Essential (primary) hypertension: Secondary | ICD-10-CM | POA: Diagnosis not present

## 2019-08-23 DIAGNOSIS — R072 Precordial pain: Secondary | ICD-10-CM

## 2019-08-23 DIAGNOSIS — R61 Generalized hyperhidrosis: Secondary | ICD-10-CM | POA: Diagnosis not present

## 2019-08-23 DIAGNOSIS — I471 Supraventricular tachycardia: Secondary | ICD-10-CM

## 2019-08-23 LAB — COMPREHENSIVE METABOLIC PANEL
ALT: 16 U/L (ref 0–44)
AST: 21 U/L (ref 15–41)
Albumin: 3.8 g/dL (ref 3.5–5.0)
Alkaline Phosphatase: 100 U/L (ref 38–126)
Anion gap: 11 (ref 5–15)
BUN: 14 mg/dL (ref 6–20)
CO2: 22 mmol/L (ref 22–32)
Calcium: 8.7 mg/dL — ABNORMAL LOW (ref 8.9–10.3)
Chloride: 107 mmol/L (ref 98–111)
Creatinine, Ser: 1.23 mg/dL (ref 0.61–1.24)
GFR calc Af Amer: >60 mL/min (ref >60)
GFR calc non Af Amer: >60 mL/min (ref >60)
Glucose, Bld: 119 mg/dL — ABNORMAL HIGH (ref 70–99)
Potassium: 4 mmol/L (ref 3.5–5.1)
Sodium: 140 mmol/L (ref 135–145)
Total Bilirubin: 0.5 mg/dL (ref 0.3–1.2)
Total Protein: 6.8 g/dL (ref 6.5–8.1)

## 2019-08-23 LAB — RAPID URINE DRUG SCREEN, HOSP PERFORMED
Amphetamines: NOT DETECTED
Barbiturates: NOT DETECTED
Benzodiazepines: NOT DETECTED
Cocaine: NOT DETECTED
Opiates: NOT DETECTED
Tetrahydrocannabinol: NOT DETECTED

## 2019-08-23 LAB — CBC WITH DIFFERENTIAL/PLATELET
Abs Immature Granulocytes: 0.04 10*3/uL (ref 0.00–0.07)
Basophils Absolute: 0.1 10*3/uL (ref 0.0–0.1)
Basophils Relative: 0 %
Eosinophils Absolute: 0.2 10*3/uL (ref 0.0–0.5)
Eosinophils Relative: 1 %
HCT: 46 % (ref 39.0–52.0)
Hemoglobin: 15.8 g/dL (ref 13.0–17.0)
Immature Granulocytes: 0 %
Lymphocytes Relative: 8 %
Lymphs Abs: 1.3 10*3/uL (ref 0.7–4.0)
MCH: 30.9 pg (ref 26.0–34.0)
MCHC: 34.3 g/dL (ref 30.0–36.0)
MCV: 89.8 fL (ref 80.0–100.0)
Monocytes Absolute: 0.8 10*3/uL (ref 0.1–1.0)
Monocytes Relative: 5 %
Neutro Abs: 14.5 10*3/uL — ABNORMAL HIGH (ref 1.7–7.7)
Neutrophils Relative %: 86 %
Platelets: 276 10*3/uL (ref 150–400)
RBC: 5.12 MIL/uL (ref 4.22–5.81)
RDW: 12.4 % (ref 11.5–15.5)
WBC: 16.9 10*3/uL — ABNORMAL HIGH (ref 4.0–10.5)
nRBC: 0 % (ref 0.0–0.2)

## 2019-08-23 LAB — LIPID PANEL
Cholesterol: 250 mg/dL — ABNORMAL HIGH (ref 0–200)
HDL: 45 mg/dL (ref >40)
LDL Cholesterol: 186 mg/dL — ABNORMAL HIGH (ref 0–99)
Total CHOL/HDL Ratio: 5.6 RATIO
Triglycerides: 97 mg/dL (ref <150)
VLDL: 19 mg/dL (ref 0–40)

## 2019-08-23 LAB — HEMOGLOBIN A1C
Hgb A1c MFr Bld: 5.2 % (ref 4.8–5.6)
Mean Plasma Glucose: 102.54 mg/dL

## 2019-08-23 LAB — TROPONIN I (HIGH SENSITIVITY)
Troponin I (High Sensitivity): 3 ng/L (ref <18)
Troponin I (High Sensitivity): 3 ng/L (ref <18)

## 2019-08-23 LAB — CBG MONITORING, ED: Glucose-Capillary: 110 mg/dL — ABNORMAL HIGH (ref 70–99)

## 2019-08-23 LAB — LIPASE, BLOOD: Lipase: 101 U/L — ABNORMAL HIGH (ref 11–51)

## 2019-08-23 LAB — TSH: TSH: 0.979 u[IU]/mL (ref 0.350–4.500)

## 2019-08-23 LAB — SARS CORONAVIRUS 2 BY RT PCR (HOSPITAL ORDER, PERFORMED IN ~~LOC~~ HOSPITAL LAB): SARS Coronavirus 2: NEGATIVE

## 2019-08-23 MED ORDER — METOPROLOL SUCCINATE ER 50 MG PO TB24
50.0000 mg | ORAL_TABLET | Freq: Two times a day (BID) | ORAL | Status: DC
  Administered 2019-08-23: 50 mg via ORAL
  Filled 2019-08-23: qty 2
  Filled 2019-08-23: qty 1

## 2019-08-23 MED ORDER — ENOXAPARIN SODIUM 40 MG/0.4ML ~~LOC~~ SOLN
40.0000 mg | SUBCUTANEOUS | Status: DC
  Administered 2019-08-23 – 2019-08-24 (×2): 40 mg via SUBCUTANEOUS
  Filled 2019-08-23: qty 0.4

## 2019-08-23 MED ORDER — ONDANSETRON HCL 4 MG/2ML IJ SOLN
4.0000 mg | Freq: Four times a day (QID) | INTRAMUSCULAR | Status: DC | PRN
  Administered 2019-08-23: 4 mg via INTRAVENOUS
  Filled 2019-08-23: qty 2

## 2019-08-23 MED ORDER — ACETAMINOPHEN 325 MG PO TABS: 650.0000 mg | ORAL_TABLET | ORAL | Status: DC | PRN

## 2019-08-23 MED ORDER — ATORVASTATIN CALCIUM 10 MG PO TABS
20.0000 mg | ORAL_TABLET | Freq: Every day | ORAL | Status: DC
  Administered 2019-08-23 – 2019-08-24 (×2): 20 mg via ORAL
  Filled 2019-08-23 (×2): qty 2

## 2019-08-23 NOTE — ED Notes (Signed)
Prior to entering the room, this patient told this tech that he had an emesis episode. Pt also stated that he could not tolerate any foods or drinks given to him but could tolerate a sprite. Sprite was given to the patient.

## 2019-08-23 NOTE — ED Notes (Signed)
Patient is resting comfortably. 

## 2019-08-23 NOTE — ED Provider Notes (Signed)
Received signout beginning of shift. Patient presenting complaining of chest discomfort that started earlier today. Pain did improve with some nitro provided by EMS. Patient has a heart score of which puts him at moderate risk of MACE. He also has an elevated lipase however no abdominal pain concerning for pancreatitis. Will consult medicine for admission for cardiac rule out.  NO active cp at this time.  4:13 PM Appreciate consultation from [facility] Dr. Ophelia Charter who agrees to see and admit pt for cardiac rule out.  COVID-19 screening test ordered.   Collin Mitchell was evaluated in Emergency Department on 08/23/2019 for the symptoms described in the history of present illness. He was evaluated in the context of the global COVID-19 pandemic, which necessitated consideration that the patient might be at risk for infection with the SARS-CoV-2 virus that causes COVID-19. Institutional protocols and algorithms that pertain to the evaluation of patients at risk for COVID-19 are in a state of rapid change based on information released by regulatory bodies including the CDC and federal and state organizations. These policies and algorithms were followed during the patient's care in the ED.   Pulse 88   Temp 97.9 F (36.6 C) (Oral)   Resp 15   Ht 5\' 9"  (1.753 m)   Wt 83.5 kg   SpO2 98%   BMI 27.17 kg/m   Results for orders placed or performed during the hospital encounter of 08/23/19  Comprehensive metabolic panel  Result Value Ref Range   Sodium 140 135 - 145 mmol/L   Potassium 4.0 3.5 - 5.1 mmol/L   Chloride 107 98 - 111 mmol/L   CO2 22 22 - 32 mmol/L   Glucose, Bld 119 (H) 70 - 99 mg/dL   BUN 14 6 - 20 mg/dL   Creatinine, Ser 10/24/19 0.61 - 1.24 mg/dL   Calcium 8.7 (L) 8.9 - 10.3 mg/dL   Total Protein 6.8 6.5 - 8.1 g/dL   Albumin 3.8 3.5 - 5.0 g/dL   AST 21 15 - 41 U/L   ALT 16 0 - 44 U/L   Alkaline Phosphatase 100 38 - 126 U/L   Total Bilirubin 0.5 0.3 - 1.2 mg/dL   GFR calc non Af  Amer >60 >60 mL/min   GFR calc Af Amer >60 >60 mL/min   Anion gap 11 5 - 15  Lipase, blood  Result Value Ref Range   Lipase 101 (H) 11 - 51 U/L  CBC with Differential/Platelet  Result Value Ref Range   WBC 16.9 (H) 4.0 - 10.5 K/uL   RBC 5.12 4.22 - 5.81 MIL/uL   Hemoglobin 15.8 13.0 - 17.0 g/dL   HCT 1.61 39 - 52 %   MCV 89.8 80.0 - 100.0 fL   MCH 30.9 26.0 - 34.0 pg   MCHC 34.3 30.0 - 36.0 g/dL   RDW 09.6 04.5 - 40.9 %   Platelets 276 150 - 400 K/uL   nRBC 0.0 0.0 - 0.2 %   Neutrophils Relative % 86 %   Neutro Abs 14.5 (H) 1.7 - 7.7 K/uL   Lymphocytes Relative 8 %   Lymphs Abs 1.3 0.7 - 4.0 K/uL   Monocytes Relative 5 %   Monocytes Absolute 0.8 0 - 1 K/uL   Eosinophils Relative 1 %   Eosinophils Absolute 0.2 0 - 0 K/uL   Basophils Relative 0 %   Basophils Absolute 0.1 0 - 0 K/uL   Immature Granulocytes 0 %   Abs Immature Granulocytes 0.04 0.00 -  0.07 K/uL  CBG monitoring, ED  Result Value Ref Range   Glucose-Capillary 110 (H) 70 - 99 mg/dL  Troponin I (High Sensitivity)  Result Value Ref Range   Troponin I (High Sensitivity) 3 <18 ng/L   DG Chest Portable 1 View  Result Date: 08/23/2019 CLINICAL DATA:  Chest pain into RIGHT arm with nausea since 0930 hours EXAM: PORTABLE CHEST 1 VIEW COMPARISON:  Portable exam 1419 hours compared to 04/11/2015 FINDINGS: Normal heart size, mediastinal contours, and pulmonary vascularity. Lungs clear. No pulmonary infiltrate, pleural effusion, or pneumothorax. Osseous structures unremarkable. IMPRESSION: No acute abnormalities. Electronically Signed   By: Ulyses Southward M.D.   On: 08/23/2019 14:55       Fayrene Helper, PA-C 08/23/19 1614    Curatolo, Adam, DO 08/23/19 2221

## 2019-08-23 NOTE — ED Triage Notes (Signed)
Pt arrives from work with cp that started at 0930 with nausea and pain going into right arm. MI and stent hx, 324 asa and 2 SL nitro & 4 mg zofran given by ems.   18G LFA placed by ems.  Pain went from 9/10 to 7/10 after nitro

## 2019-08-23 NOTE — ED Provider Notes (Signed)
[facility] EMERGENCY DEPARTMENT Provider Note   CSN: [id number] Arrival date & time: 08/23/19  1335     History Chief Complaint  Patient presents with  . Chest Pain    Collin Mitchell is a 49 y.o. male with a past medical history of hypertension, hypercholesterolemia, MI, SVT, who presents today for evaluation of chest pain.  He reports that he was at work when he started developing chest pain at about 9:30 AM.  He has had nausea and vomited once.  He reports a sharp pain in the middle of his chest, initially was a 10 out of 10.  He was given 2 sublingual nitro by EMS, 324 mg of aspirin and after that his pain reduced to a 7 out of 10.  He reports that his nausea is improved after he got Zofran by EMS.  He does not take any blood thinning medications or erectile dysfunction medications.  He denies any fevers.  He reports he is fully vaccinated against coronavirus.  He states that his symptoms have been worsening throughout the day.  He denies any trauma.  No diarrhea or constipation.  HPI     Past Medical History:  Diagnosis Date  . Hypercholesterolemia   . Hypertension   . Irregular heart beat   . Myocardial infarction (HCC)   . SVT (supraventricular tachycardia) (HCC)     There are no problems to display for this patient.   Past Surgical History:  Procedure Laterality Date  . CARDIAC ELECTROPHYSIOLOGY MAPPING AND ABLATION         No family history on file.  Social History   Tobacco Use  . Smoking status: Never Smoker  . Smokeless tobacco: Never Used  Substance Use Topics  . Alcohol use: No  . Drug use: No    Home Medications Prior to Admission medications   Medication Sig Start Date End Date Taking? Authorizing Provider  metoprolol succinate (TOPROL-XL) 25 MG 24 hr tablet Take 50 mg by mouth 2 (two) times daily. 08/16/17  Yes [provider]  benzonatate (TESSALON PERLES) 100 MG capsule Take one or 2 tablets every 8 hours as needed for  cough. Patient not taking: Reported on 08/23/2019 08/13/16   Bridget Hartshorn L, PA-C  ondansetron (ZOFRAN-ODT) 4 MG disintegrating tablet Take 1 tablet (4 mg total) by mouth every 8 (eight) hours as needed for nausea or vomiting. Patient not taking: Reported on 08/23/2019 02/06/18   Chinita Pester, FNP    Allergies    Penicillins  Review of Systems   Review of Systems  Constitutional: Positive for diaphoresis. Negative for chills and fever.  Respiratory: Negative for shortness of breath.   Cardiovascular: Positive for chest pain. Negative for leg swelling.  Gastrointestinal: Positive for nausea and vomiting. Negative for abdominal pain, constipation and diarrhea.  Genitourinary: Negative for difficulty urinating.  Musculoskeletal: Negative for back pain and neck pain.  Neurological: Negative for weakness and headaches.  All other systems reviewed and are negative.   Physical Exam Updated Vital Signs Pulse 88   Temp 97.9 F (36.6 C) (Oral)   Resp 15   Ht 5\' 9"  (1.753 m)   Wt 83.5 kg   SpO2 98%   BMI 27.17 kg/m   Physical Exam Vitals and nursing note reviewed.  Constitutional:      Appearance: He is diaphoretic.  HENT:     Head: Normocephalic and atraumatic.  Eyes:     Conjunctiva/sclera: Conjunctivae normal.  Cardiovascular:  Rate and Rhythm: Normal rate and regular rhythm.     Pulses:          Radial pulses are 2+ on the right side and 2+ on the left side.       Dorsalis pedis pulses are 2+ on the right side and 2+ on the left side.       Posterior tibial pulses are 2+ on the right side and 2+ on the left side.     Heart sounds: Normal heart sounds. No murmur heard.   Pulmonary:     Effort: Pulmonary effort is normal. No respiratory distress.     Breath sounds: Normal breath sounds.  Abdominal:     Palpations: Abdomen is soft. There is no mass.     Tenderness: There is no abdominal tenderness.  Musculoskeletal:     Cervical back: Normal range of motion and neck  supple.     Right lower leg: No tenderness. No edema.     Left lower leg: No tenderness. No edema.  Skin:    General: Skin is warm.  Neurological:     General: No focal deficit present.     Mental Status: He is alert.  Psychiatric:        Mood and Affect: Mood normal.        Behavior: Behavior normal.     ED Results / Procedures / Treatments   Labs (all labs ordered are listed, but only abnormal results are displayed) Labs Reviewed  COMPREHENSIVE METABOLIC PANEL - Abnormal; Notable for the following components:      Result Value   Glucose, Bld 119 (*)    Calcium 8.7 (*)    All other components within normal limits  LIPASE, BLOOD - Abnormal; Notable for the following components:   Lipase 101 (*)    All other components within normal limits  CBC WITH DIFFERENTIAL/PLATELET - Abnormal; Notable for the following components:   WBC 16.9 (*)    Neutro Abs 14.5 (*)    All other components within normal limits  CBG MONITORING, ED - Abnormal; Notable for the following components:   Glucose-Capillary 110 (*)    All other components within normal limits  SARS CORONAVIRUS 2 BY RT PCR (HOSPITAL ORDER, PERFORMED IN [facility])  TROPONIN I (HIGH SENSITIVITY)  TROPONIN I (HIGH SENSITIVITY)    EKG EKG Interpretation  Date/Time:  Wednesday August 23 2019 13:55:46 EDT Ventricular Rate:  97 PR Interval:    QRS Duration: 91 QT Interval:  358 QTC Calculation: 455 R Axis:   -7 Text Interpretation: Sinus rhythm Abnormal R-wave progression, early transition Since prior ECG, rate has decreased, rhythm is now sinus Confirmed by Alvira Monday ([id number]) on 08/23/2019 2:14:33 PM   Radiology DG Chest Portable 1 View  Result Date: 08/23/2019 CLINICAL DATA:  Chest pain into RIGHT arm with nausea since 0930 hours EXAM: PORTABLE CHEST 1 VIEW COMPARISON:  Portable exam 1419 hours compared to 04/11/2015 FINDINGS: Normal heart size, mediastinal contours, and pulmonary vascularity. Lungs  clear. No pulmonary infiltrate, pleural effusion, or pneumothorax. Osseous structures unremarkable. IMPRESSION: No acute abnormalities. Electronically Signed   By: Ulyses Southward M.D.   On: 08/23/2019 14:55    Procedures Procedures (including critical care time)  Medications Ordered in ED Medications - No data to display  ED Course  I have reviewed the triage vital signs and the nursing notes.  Pertinent labs & imaging results that were available during my care of the patient were reviewed by me  and considered in my medical decision making (see chart for details).    MDM Rules/Calculators/A&P                         Patient is a 49 year old man presents today for evaluation of chest pain that started at about 9 this morning.  His chest pain is in the middle of his chest, sharp.  Improved with nitro.  His a had aspirin with EMS.  He has a previous MI.  Chest pain radiates into his arms.  He has been nauseous, vomited and on my exam is diaphoretic.  Distal pulses intact.  Labs obtained reviewed, mild leukocytosis at 16.9.  CMP is unremarkable.  Troponin is 3.  Lipase is mildly elevated at 101.  His abdomen is soft nontender nondistended.  Covid test is ordered.  Clinically based on his chest pain, radiation, and improvement with nitro, nausea/vomiting, diaphoresis, prior MI and risk factors I am concerned for a cardiac cause for his chest pain.   At shift change care was transferred to Fayrene Helper PA-C who will follow pending studies, re-evaulate and determine disposition.    Final Clinical Impression(s) / ED Diagnoses Final diagnoses:  Chest pain, unspecified type    Rx / DC Orders ED Discharge Orders    None       Cristina Gong, PA-C 08/23/19 1601    Alvira Monday, MD 08/24/19 2231

## 2019-08-23 NOTE — H&P (Signed)
History and Physical    Collin Mitchell:[id number] DOB: December 11, 1970 DOA: 08/23/2019  PCP: [facility] Consultants:  None Patient coming from:  Home - lives with wife, son, and daughter; Collin Mitchell: Wife, [phone]  Chief Complaint: chest pain  HPI: Collin Mitchell is a 49 y.o. male with medical history significant of CAD; HTN; SVT; and HLD presenting with chest pain.  He was evaluated by [facility] cardiology in the past and appears to have been referred for ETT in 07/2018 but there is no evidence of this having been performed.  He reports about 9AM he started with mid-umbilical pain.  Shortly thereafter, he started with chest tightness.  He drank something and it got worse.  He vomited once with a scant amount of blood and he got sweaty and SOB.  Pain was substernal in nature.  It got better with NTG.  The pain still comes and goes a little but is mostly gone.  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  He reports last stress test was at least 5 years ago, around the time of his ablation.  He acknowledges never going for the stress test ordered last June.     ED Course:  Heart score 5-6, chest pain.  Resolved with NTG.  Evaluation unremarkable so far.  Likely needs r/o.  WBC 16.9, lipase 101 - ?reason.  Review of Systems: As per HPI; otherwise review of systems reviewed and negative.   Ambulatory Status:  Ambulates without assistance  COVID Vaccine Status:  Complete  Past Medical History:  Diagnosis Date  . Hypercholesterolemia   . Hypertension   . SVT (supraventricular tachycardia) (HCC)    s/p ablation    Past Surgical History:  Procedure Laterality Date  . CARDIAC ELECTROPHYSIOLOGY MAPPING AND ABLATION      Social History   Socioeconomic History  . Marital status: Married    Spouse name: Not on file  . Number of children: Not on file  . Years of education: Not on file  . Highest education level: Not on  file  Occupational History  . Occupation: manual labor  Tobacco Use  . Smoking status: Never Smoker  . Smokeless tobacco: Never Used  Substance and Sexual Activity  . Alcohol use: No  . Drug use: No  . Sexual activity: Not on file  Other Topics Concern  . Not on file  Social History Narrative  . Not on file   Social Determinants of Health   Financial Resource Strain:   . Difficulty of Paying Living Expenses:   Food Insecurity:   . Worried About Programme researcher, broadcasting/film/video in the Last Year:   . Barista in the Last Year:   Transportation Needs:   . Freight forwarder (Medical):   [name] Lack of Transportation (Non-Medical):   Physical Activity:   . Days of Exercise per Week:   . Minutes of Exercise per Session:   Stress:   . Feeling of Stress :   Social Connections:   . Frequency of Communication with Friends and Family:   . Frequency of Social Gatherings with Friends and Family:   . Attends Religious Services:   . Active Member of Clubs or Organizations:   . Attends Banker Meetings:   [name] Marital Status:   Intimate Partner Violence:   . Fear of Current or Ex-Partner:   . Emotionally Abused:   [name] Physically Abused:   .  Sexually Abused:     Allergies  Allergen Reactions  . Penicillins Hives    Family History  Problem Relation Age of Onset  . Valvular heart disease Mother   . CAD Mother 45  . COPD Father   . CAD Maternal Grandmother   . Supraventricular tachycardia Sister        s/p ablation    Prior to Admission medications   Medication Sig Start Date End Date Taking? Authorizing Provider  metoprolol succinate (TOPROL-XL) 25 MG 24 hr tablet Take 50 mg by mouth 2 (two) times daily. 08/16/17  Yes [provider]  benzonatate (TESSALON PERLES) 100 MG capsule Take one or 2 tablets every 8 hours as needed for cough. Patient not taking: Reported on 08/23/2019 08/13/16   Bridget Hartshorn L, PA-C  ondansetron (ZOFRAN-ODT) 4 MG disintegrating tablet  Take 1 tablet (4 mg total) by mouth every 8 (eight) hours as needed for nausea or vomiting. Patient not taking: Reported on 08/23/2019 02/06/18   Chinita Pester, FNP    Physical Exam: Vitals:   08/23/19 1530 08/23/19 1545 08/23/19 1600 08/23/19 1615  BP: 107/80 (!) 113/93 125/83 98/70  Pulse: 71 92 77 90  Resp: 14 16 13 15   Temp:      TempSrc:      SpO2: 96% 99% 95% 98%  Weight:      Height:         . General:  Appears calm and comfortable and is NAD . Eyes:  PERRL, EOMI, normal lids, iris . ENT:  grossly normal hearing, lips & tongue, mmm; poor dentition . Neck:  no LAD, masses or thyromegaly . Cardiovascular:  RRR, no m/r/g. No LE edema.  CP is not reproducible. Respiratory:   CTA bilaterally with no wheezes/rales/rhonchi.  Normal respiratory effort. . Abdomen:  soft, NT, ND, NABS . Skin:  no rash or induration seen on limited exam . Musculoskeletal:  grossly normal tone BUE/BLE, good ROM, no bony abnormality . Psychiatric:  grossly normal mood and affect, speech fluent and appropriate, AOx3 . Neurologic:  CN 2-12 grossly intact, moves all extremities in coordinated fashion    Radiological Exams on Admission: DG Chest Portable 1 View  Result Date: 08/23/2019 CLINICAL DATA:  Chest pain into RIGHT arm with nausea since 0930 hours EXAM: PORTABLE CHEST 1 VIEW COMPARISON:  Portable exam 1419 hours compared to 04/11/2015 FINDINGS: Normal heart size, mediastinal contours, and pulmonary vascularity. Lungs clear. No pulmonary infiltrate, pleural effusion, or pneumothorax. Osseous structures unremarkable. IMPRESSION: No acute abnormalities. Electronically Signed   By: 04/13/2015 M.D.   On: 08/23/2019 14:55    EKG: Independently reviewed.  NSR with rate 97; nonspecific ST changes with no evidence of acute ischemia   Labs on Admission: I have personally reviewed the available labs and imaging studies at the time of the admission.  Pertinent labs:   Glucose 119 Lipase 101 HS  troponin 3, 3 WBC 16.9; 15.1 in 01/2018   Assessment/Plan Principal Problem:   Chest pain Active Problems:   SVT (supraventricular tachycardia) (HCC)   Hypertension   Hypercholesterolemia   Hyperglycemia   Chest pain -Patient with substernal chest pressure that started with exertion and improved/resolved with NTG -Currently with minimal to no CP -3/3 typical symptoms concerning for cardiac CP. -CXR unremarkable.   -Initial cardiac HS troponin negative.  -EKG not indicative of acute ischemia.   -HEART pathway score is 6, indicating that the patient has an elevated risk score and requires further evaluation. -Will  plan to place in observation status on telemetry to rule out ACS by overnight observation.  -Start ASA 81 mg daily -Risk factor stratification with HgbA1c and FLP; will also check TSH and UDS -Cardiology consultation in AM -NPO for possible stress test   HTN -Takes Toprol XL monotherapy at home, will continue  HLD -He is not currently on lipid lowering therapy despite prior LDL of 168.7 in 2018 -Check lipids -Will empirically start Lipitor 20 mg daily for now  Hyperglycemia -Glucose 119 -Diet controlled -Last A1c was 5.2 in 2018, will recheck -There is no indication to start medication at this time  SVT -h/o SVT, s/p remote ablation    Note: This patient has been tested and is pending for the novel coronavirus COVID-19.    DVT prophylaxis: Lovenox  Code Status:  Full - confirmed with patient/wife Family Communication: Wife was present throughout evaluation Disposition Plan:  The patient is from: home  Anticipated d/c is to: home without [facility] services  Anticipated d/c date will depend on clinical response to treatment, but possibly as early as tomorrow if he has excellent response to treatment  Patient is currently: acutely ill Consults called: Cardiology  Admission status: It is my clinical opinion that referral for OBSERVATION is reasonable and  necessary in this patient based on the above information provided. The aforementioned taken together are felt to place the patient at high risk for further clinical deterioration. However it is anticipated that the patient may be medically stable for discharge from the hospital within 24 to 48 hours.    Jonah Blue MD [facility]   How to contact the [facility] Attending or Consulting provider 7A - 7P or covering provider during after hours 7P -7A, for this patient?  1. Check the care team in [facility] and look for a) attending/consulting [facility] provider listed and b) the [facility] team listed 2. Log into [email or web address] and use [place]'s universal password to access. If you do not have the password, please contact the hospital operator. 3. Locate the [facility] provider you are looking for under [facility] and page to a number that you can be directly reached. 4. If you still have difficulty reaching the provider, please page the [facility] (Director on Call) for the Hospitalists listed on amion for assistance.   08/23/2019, 5:30 PM

## 2019-08-24 ENCOUNTER — Observation Stay (HOSPITAL_COMMUNITY): Payer: 59

## 2019-08-24 DIAGNOSIS — R079 Chest pain, unspecified: Secondary | ICD-10-CM

## 2019-08-24 DIAGNOSIS — I200 Unstable angina: Secondary | ICD-10-CM

## 2019-08-24 DIAGNOSIS — I471 Supraventricular tachycardia: Secondary | ICD-10-CM | POA: Diagnosis not present

## 2019-08-24 LAB — HIV ANTIBODY (ROUTINE TESTING W REFLEX): HIV Screen 4th Generation wRfx: NONREACTIVE

## 2019-08-24 MED ORDER — NITROGLYCERIN 0.4 MG SL SUBL
SUBLINGUAL_TABLET | SUBLINGUAL | Status: AC
  Filled 2019-08-24: qty 2

## 2019-08-24 MED ORDER — IOHEXOL 350 MG/ML SOLN
80.0000 mL | Freq: Once | INTRAVENOUS | Status: AC | PRN
  Administered 2019-08-24: 80 mL via INTRAVENOUS

## 2019-08-24 MED ORDER — ATORVASTATIN CALCIUM 20 MG PO TABS: 20.0000 mg | ORAL_TABLET | Freq: Every day | ORAL | 0 refills | Status: DC

## 2019-08-24 MED ORDER — NITROGLYCERIN 0.4 MG SL SUBL
0.8000 mg | SUBLINGUAL_TABLET | Freq: Once | SUBLINGUAL | Status: AC
  Administered 2019-08-24: 0.8 mg via SUBLINGUAL

## 2019-08-24 NOTE — Progress Notes (Signed)
Pt and wife given Discharge instructions and they stated understanding. IV has been removed and pt dressed himself, 1 new medication escribed to pt home pharmacy. Pt stated that he wanted to walk out to the car he felt fine. D/C with wife

## 2019-08-24 NOTE — Progress Notes (Signed)
    CT scan of coronary arteries is reassuring with no evidence of CAD.  Calcium score is 0.  Noncardiac chest discomfort.  Comfortable from a cardiology standpoint for discharge.  [name], MD

## 2019-08-24 NOTE — ED Notes (Addendum)
Pt denies any chest pain, resting comfortably

## 2019-08-24 NOTE — Consult Note (Addendum)
Cardiology Consultation:   Patient ID: [name] MRN: [id number]; DOB: [birth year]  Admit date: 08/23/2019 Date of Consult: 08/24/2019  Primary Care Provider: [facility] Cardiologist: [facility]   Patient Profile:   Collin Mitchell is a 49 y.o. male with a hx of PSVT s/p ablation and hypertension who is being seen today for the evaluation of chest pain/palpitation at the request of Dr. Ophelia Charter.  Normal Exercise stress test 05/2014. Has prior history of palpitation and found to have SVT s/p ablation 06/2015.  Last seen by primary cardiologist 07/25/2018: He was having exertional chest pain and shortness of breath.  Order exercise stress test but never done.  History of Present Illness:   Collin Mitchell brought by EMS for chest pain evaluation.  Patient had sudden onset chest tightness radiating to right arm and jaw.  It was associated with diaphoresis, shortness of breath, nausea and palpitations.  EMS was called and given aspirin 324 mg, sublingual nitroglycerin x2 and 4 mg of Zofran with improved symptoms.  Admitted overnight.  Troponin negative x2.  Creatinine normal.  LDL 186.  Leukocytosis at 16.  Hemoglobin A1c 5.2.  TSH normal.  Urine drug screen clear.  Covid negative.  Chest x-ray without acute finding.  Patient denies recurrent episodes since admitted.  He reports longstanding history of exertional chest tightness and pressure at work.  Usually relieves with rest.  He does moving boxes at work.  Reports intermittent palpitation lasting for 10 minutes.  Occurs one time a day.  His palpitation and chest pain associated occasionally but not all the time.  Denies dizziness, orthopnea, PND, syncope, lower extremity edema or melena.  No illicit drug use or alcohol abuse.   Past Medical History:  Diagnosis Date  . Hypercholesterolemia   . Hypertension   . SVT (supraventricular tachycardia) (HCC)    s/p ablation    Past Surgical History:    Procedure Laterality Date  . CARDIAC ELECTROPHYSIOLOGY MAPPING AND ABLATION       Inpatient Medications: Scheduled Meds: . atorvastatin  20 mg Oral Daily  . enoxaparin (LOVENOX) injection  40 mg Subcutaneous Q24H  . metoprolol succinate  50 mg Oral BID   Continuous Infusions:  PRN Meds: acetaminophen, ondansetron (ZOFRAN) IV  Allergies:    Allergies  Allergen Reactions  . Penicillins Hives    Social History:   Social History   Socioeconomic History  . Marital status: Married    Spouse name: Not on file  . Number of children: Not on file  . Years of education: Not on file  . Highest education level: Not on file  Occupational History  . Occupation: manual labor  Tobacco Use  . Smoking status: Never Smoker  . Smokeless tobacco: Never Used  Substance and Sexual Activity  . Alcohol use: No  . Drug use: No  . Sexual activity: Not on file  Other Topics Concern  . Not on file  Social History Narrative  . Not on file   Social Determinants of Health   Financial Resource Strain:   . Difficulty of Paying Living Expenses:   Food Insecurity:   . Worried About Programme researcher, broadcasting/film/video in the Last Year:   . Barista in the Last Year:   Transportation Needs:   . Freight forwarder (Medical):   [name] Lack of Transportation (Non-Medical):   Physical Activity:   . Days of Exercise per Week:   . Minutes of Exercise per  Session:   Stress:   . Feeling of Stress :   Social Connections:   . Frequency of Communication with Friends and Family:   . Frequency of Social Gatherings with Friends and Family:   . Attends Religious Services:   . Active Member of Clubs or Organizations:   . Attends Banker Meetings:   [name] Marital Status:   Intimate Partner Violence:   . Fear of Current or Ex-Partner:   . Emotionally Abused:   [name] Physically Abused:   . Sexually Abused:     Family History:    Family History  Problem Relation Age of Onset  . Valvular heart disease  Mother   . CAD Mother 6  . COPD Father   . CAD Maternal Grandmother   . Supraventricular tachycardia Sister        s/p ablation     ROS:  Please see the history of present illness.  All other ROS reviewed and negative.     Physical Exam/Data:   Vitals:   08/24/19 0400 08/24/19 0500 08/24/19 0600 08/24/19 0700  BP: (!) 94/55 100/74 108/79 94/60  Pulse: 63 71 65 63  Resp:   17 16  Temp:      TempSrc:      SpO2: 95% 95% 96% 94%  Weight:      Height:       No intake or output data in the 24 hours ending 08/24/19 1102 Last 3 Weights 08/23/2019 03/13/2018 02/06/2018  Weight (lbs) 184 lb 186 lb 178 lb  Weight (kg) 83.462 kg 84.369 kg 80.74 kg     Body mass index is 27.17 kg/m.  General:  Well nourished, well developed, in no acute distress HEENT: normal Lymph: no adenopathy Neck: no JVD Endocrine:  No thryomegaly Vascular: No carotid bruits; FA pulses 2+ bilaterally without bruits  Cardiac:  normal S1, S2; RRR; no murmur Lungs:  clear to auscultation bilaterally, no wheezing, rhonchi or rales  Abd: soft, nontender, no hepatomegaly  Ext: no edema Musculoskeletal:  No deformities, BUE and BLE strength normal and equal Skin: warm and dry  Neuro:  CNs 2-12 intact, no focal abnormalities noted Psych:  Normal affect   EKG:  The EKG was personally reviewed and demonstrates:  NSR at rate of 62 bpm Telemetry:  Telemetry was personally reviewed and demonstrates: Sinus rhythm rate of 70s, brief sinus tachycardia in 120s  Relevant CV Studies:   Exercise stress test 05/2014 Procedure:  The patient performed treadmill exercise using a Bruce protocol for 9:30  minutes. The exercise test was stopped due to fatigue.Blood pressure  response was normal.   Rest HR: 72bpm  Rest BP: 126/71mmHg  Max HR: 184bpm  Max BP: 144/40mmHg  Mets: 11.70  % MAX HR: 104%   Stress Test Administered by: Percell Boston, CMA   ECG Interpretation:  Rest VOJ:JKKXFG sinus rhythm, none    Stress HWE:XHBZJ tachycardia,  Recovery IRC:VELFYB sinus rhythm  ECG Interpretation:negative, nondiagnostic changes.   Gated LV Analysis:    LVEF= 57 %   FINDINGS:  Regional wall motion:reveals normal myocardial thickening and wall  motion.  The overall quality of the study is good.  Artifacts noted: no  Left ventricular cavity: normal.   Laboratory Data:  High Sensitivity Troponin:   Recent Labs  Lab 08/23/19 1406 08/23/19 1610  TROPONINIHS 3 3     Chemistry Recent Labs  Lab 08/23/19 1406  NA 140  K 4.0  CL 107  CO2 22  GLUCOSE 119*  BUN 14  CREATININE 1.23  CALCIUM 8.7*  GFRNONAA >60  GFRAA >60  ANIONGAP 11    Recent Labs  Lab 08/23/19 1406  PROT 6.8  ALBUMIN 3.8  AST 21  ALT 16  ALKPHOS 100  BILITOT 0.5   Hematology Recent Labs  Lab 08/23/19 1406  WBC 16.9*  RBC 5.12  HGB 15.8  HCT 46.0  MCV 89.8  MCH 30.9  MCHC 34.3  RDW 12.4  PLT 276   Radiology/Studies:  DG Chest Portable 1 View  Result Date: 08/23/2019 CLINICAL DATA:  Chest pain into RIGHT arm with nausea since 0930 hours EXAM: PORTABLE CHEST 1 VIEW COMPARISON:  Portable exam 1419 hours compared to 04/11/2015 FINDINGS: Normal heart size, mediastinal contours, and pulmonary vascularity. Lungs clear. No pulmonary infiltrate, pleural effusion, or pneumothorax. Osseous structures unremarkable. IMPRESSION: No acute abnormalities. Electronically Signed   By: Ulyses Southward M.D.   On: 08/23/2019 14:55    HEAR Score (for undifferentiated chest pain):  HEAR Score: 4     Assessment and Plan:   1. Exertional chest tightness Patient with longstanding history of exertional chest pressure and shortness of breath.  Evaluated by primary cardiologist last year and order stress test but never done.  He reports symptoms stable since then however had a worse episode yesterday as summarized above.  He has been ruled out.  Troponin negative.  EKG without acute ischemic changes.  His symptoms  could be anginal equivalent or related to tachycardia.  Keep n.p.o.  - Will do Coronary CT today   2.  Palpitation with history of SVT s/p ablation in 2017 -Patient reports daily history of palpitation lasting for 10 minutes -His chest pain does occur sometimes with palpitation but not all the time -Reports compliance with metoprolol -TSH normal  3.  Hypertension -Blood pressure soft   For questions or updates, please contact [facility] Please consult [email or web address] for contact info under    Lorelei Pont, PA  08/24/2019 11:02 AM   Personally seen and examined. Agree with above.   49 year old with previous SVT ablation, intermittent tachycardia here with exertional chest discomfort right jaw right arm during work.  Prior stress test 2016 unremarkable.  Did not go forward with stress test a year ago.  Currently chest pain-free.  Pleasant.  GEN: Well nourished, well developed, in no acute distress  HEENT: normal  Neck: no JVD, carotid bruits, or masses Cardiac: RRR; no murmurs, rubs, or gallops,no edema  Respiratory:  clear to auscultation bilaterally, normal work of breathing GI: soft, nontender, nondistended, + BS MS: no deformity or atrophy  Skin: warm and dry, no rash Neuro:  Alert and Oriented x 3, Strength and sensation are intact Psych: euthymic mood, full affect  Troponins have been negative.  EKG unremarkable no ischemic changes.  Assessment and plan:  Exertional chest discomfort -Recurrent symptoms.  We will go ahead and proceed with coronary CT scan.  Metoprolol prior.  PSVT -Ablation 2017.  Occasional palpitations.  Taking Toprol-XL 50 mg a day.  I did notice on his telemetry that for about an hour's period of time he did have an elevated heart rate in the 90s but it appeared to be sinus rhythm.  It then decreased back to where he currently is in the upper 50s low 60s.  He is followed by cardiologist at [facility].  [name], MD

## 2019-08-24 NOTE — ED Notes (Signed)
Ordered breakfast--Collin Mitchell 

## 2019-09-05 NOTE — Discharge Summary (Signed)
Physician Discharge Summary  Collin Mitchell VOZ:[id number] DOB: 03/15/1970 DOA: 08/23/2019  PCP: [place], [place] date: 08/23/2019 Discharge date: 09/05/2019  Time spent: 45 minutes  Recommendations for Outpatient Follow-up:  1. Needs outpatient follow-up cardiology at [facility]  Discharge Diagnoses:  Principal Problem:   Chest pain Active Problems:   SVT (supraventricular tachycardia) ([place])   Hypertension   Hypercholesterolemia   Hyperglycemia   Discharge Condition: Improved  Diet recommendation: Heart healthy  Filed Weights   08/23/19 1359  Weight: 83.5 kg    History of present illness:  Patient admitted morning 7/8 status post SVT ablation tachycardia jaw pain while doing work Prior stress test 2016 unremarkable Did not have a stress test preop previous to this He had an ablation for PSVT 2017 with occasional palpitations Because of his chest pain he was seen and evaluated in the emergency room by cardiology his troponins were negative EKG was nonischemic and a coronary CT was performed  Coronary CT came back showing low risk stratification and it was felt from cardiology's perspective that he could discharge home with close [facility] cardiology follow-up He had met maximal hospital benefit during his short stay and was stable for discharge home on the same day  Discharge Exam: Vitals:   08/24/19 1218 08/24/19 1657  BP: 125/82 107/70  Pulse: 66 (!) 52  Resp: 16   Temp: 98.5 F (36.9 C)   SpO2: 97%     General: Awake alert coherent no distress EOMI NCAT no focal deficit Cardiovascular: S1-S2 no murmur rub or gallop PSVT noted Respiratory: Clear no added sound Abdomen soft no rebound no guarding Neurologically intact no focal deficit  Discharge Instructions   Discharge Instructions    Diet - low sodium heart healthy   Complete by: As directed    Increase activity slowly   Complete by: As directed      Allergies as of 08/24/2019       Reactions   Penicillins Hives      Medication List    TAKE these medications   atorvastatin 20 MG tablet Commonly known as: LIPITOR Take 1 tablet (20 mg total) by mouth daily.   metoprolol succinate 25 MG 24 hr tablet Commonly known as: TOPROL-XL Take 50 mg by mouth 2 (two) times daily.      Allergies  Allergen Reactions  . Penicillins Hives      The results of significant diagnostics from this hospitalization (including imaging, microbiology, ancillary and laboratory) are listed below for reference.    Significant Diagnostic Studies: CT CORONARY MORPH W/CTA COR W/SCORE W/CA W/CM &/OR WO/CM  Addendum Date: 08/24/2019   ADDENDUM REPORT: 08/24/2019 14:36 CLINICAL DATA:  49 year old with chest pain ongoing, prior SVT ablation. EXAM: Cardiac/Coronary  CTA TECHNIQUE: The patient was scanned on a [employer]. FINDINGS: A 120 kV prospective scan was triggered in the descending thoracic aorta at 111 HU's. Axial non-contrast 3 mm slices were carried out through the heart. The data set was analyzed on a dedicated work station and scored using the [place]. Gantry rotation speed was 250 msecs and collimation was .6 mm. Beta blockade and 0.8 mg of sl NTG was given. The 3D data set was reconstructed in 5% intervals of the 67-82 % of the R-R cycle. Diastolic phases were analyzed on a dedicated work station using MPR, MIP and VRT modes. The patient received 80 cc of contrast. Aorta:  Normal size.  No calcifications.  No dissection. Aortic Valve:  [place].  Mild annular calcification. Coronary Arteries:  Normal coronary origin.  Right dominance. RCA is a large dominant artery that gives rise to PDA and PLA. There is no plaque. Left main is a large artery that gives rise to LAD and LCX arteries. LAD is a large vessel that has no plaque. LCX is a non-dominant artery that gives rise to one large OM1 branch. There is no plaque. There is stair step artifact from palpitation. Images are  interpretable when combining phases. Other findings: Normal pulmonary vein drainage into the left atrium. Normal left atrial appendage without a thrombus. Normal size of the pulmonary artery. Please see radiology report for non cardiac findings. IMPRESSION: 1. Coronary calcium score of 0. This was 0 percentile for age and sex matched control. 2. Normal coronary origin with right dominance. 3. No evidence of CAD. 4. Minimal aortic annular calcification. 5. CAD-RADS 0. No evidence of CAD (0%). Consider non-atherosclerotic causes of chest pain. Candee Furbish, MD [facility] Electronically Signed   By: Candee Furbish MD   On: 08/24/2019 14:36   Result Date: 08/24/2019 EXAM: OVER-READ INTERPRETATION  CT CHEST The following report is an over-read performed by radiologist Dr. Suzy Bouchard of [facility], [place] on 08/24/2019. This over-read does not include interpretation of cardiac or coronary anatomy or pathology. The CTA interpretation by the cardiologist is attached. COMPARISON:  None. FINDINGS: Limited view of the lung parenchyma demonstrates no suspicious nodularity. Airways are normal. Limited view of the mediastinum demonstrates no adenopathy. Esophagus normal. Limited view of the upper abdomen unremarkable. Limited view of the skeleton and chest wall is unremarkable. IMPRESSION: No significant extracardiac findings. Electronically Signed: By: Suzy Bouchard M.D. On: 08/24/2019 13:38   DG Chest Portable 1 View  Result Date: 08/23/2019 CLINICAL DATA:  Chest pain into RIGHT arm with nausea since 0930 hours EXAM: PORTABLE CHEST 1 VIEW COMPARISON:  Portable exam 1419 hours compared to 04/11/2015 FINDINGS: Normal heart size, mediastinal contours, and pulmonary vascularity. Lungs clear. No pulmonary infiltrate, pleural effusion, or pneumothorax. Osseous structures unremarkable. IMPRESSION: No acute abnormalities. Electronically Signed   By: Lavonia Dana M.D.   On: 08/23/2019 14:55    Microbiology: No results found  for this or any previous visit (from the past 240 hour(s)).   Labs: Basic Metabolic Panel: No results for input(s): NA, K, CL, CO2, GLUCOSE, BUN, CREATININE, CALCIUM, MG, PHOS in the last 168 hours. Liver Function Tests: No results for input(s): AST, ALT, ALKPHOS, BILITOT, PROT, ALBUMIN in the last 168 hours. No results for input(s): LIPASE, AMYLASE in the last 168 hours. No results for input(s): AMMONIA in the last 168 hours. CBC: No results for input(s): WBC, NEUTROABS, HGB, HCT, MCV, PLT in the last 168 hours. Cardiac Enzymes: No results for input(s): CKTOTAL, CKMB, CKMBINDEX, TROPONINI in the last 168 hours. BNP: BNP (last 3 results) No results for input(s): BNP in the last 8760 hours.  ProBNP (last 3 results) No results for input(s): PROBNP in the last 8760 hours.  CBG: No results for input(s): GLUCAP in the last 168 hours.     Signed:  Nita Sells MD   [facility] 09/05/2019, 6:42 PM

## 2021-07-11 ENCOUNTER — Other Ambulatory Visit: Payer: Self-pay

## 2021-07-11 ENCOUNTER — Emergency Department
Admission: EM | Admit: 2021-07-11 | Discharge: 2021-07-11 | Disposition: A | Discharge status: Home / Self Care | Payer: 59 | Attending: Emergency Medicine | Admitting: Emergency Medicine

## 2021-07-11 ENCOUNTER — Emergency Department: Payer: 59

## 2021-07-11 DIAGNOSIS — I10 Essential (primary) hypertension: Secondary | ICD-10-CM | POA: Insufficient documentation

## 2021-07-11 DIAGNOSIS — R519 Headache, unspecified: Secondary | ICD-10-CM | POA: Diagnosis not present

## 2021-07-11 DIAGNOSIS — G8929 Other chronic pain: Secondary | ICD-10-CM

## 2021-07-11 NOTE — Discharge Instructions (Addendum)
-  Continue to treat pain with Tylenol/ibuprofen as needed.  -Follow-up with your primary care provider for blood pressure management.  -Follow-up with the neurologist listed above if your headaches persist.  -Return to the emergency department anytime if you begin to experience any new or worsening symptoms.

## 2021-07-11 NOTE — ED Provider Notes (Signed)
[facility] Provider Note    Event Date/Time   First MD Initiated Contact with Patient 07/11/21 2023     (approximate)   History   Chief Complaint Headache   HPI Collin Mitchell is a 51 y.o. male, history of hypertension, hyperlipidemia, SVT, presents to the emergency department for evaluation of headache.  Patient states that he has been experiencing consistent headaches for the past 6 months, particular along the frontal aspect of his head.  Patient states he has been taking Tylenol/ibuprofen at home, with minimal relief.  Patient additionally reports occasional blurry vision and tinnitus as well that tend to self resolve.  Denies fever/chills, neck pain, chest pain, shortness of breath, abdominal pain, dizziness/lightheadedness, or nausea/vomiting.  History Limitations: No limitations.        Physical Exam  Triage Vital Signs: ED Triage Vitals  Enc Vitals Group     BP 07/11/21 1923 (!) 163/101     Pulse Rate 07/11/21 1923 70     Resp 07/11/21 1923 16     Temp 07/11/21 1923 98.6 F (37 C)     Temp Source 07/11/21 1923 Oral     SpO2 07/11/21 1923 95 %     Weight 07/11/21 1923 186 lb (84.4 kg)     Height 07/11/21 1923 5\' 9"  (1.753 m)     Head Circumference --      Peak Flow --      Pain Score 07/11/21 1932 10     Pain Loc --      Pain Edu? --      Excl. in GC? --     Most recent vital signs: Vitals:   07/11/21 1923  BP: (!) 163/101  Pulse: 70  Resp: 16  Temp: 98.6 F (37 C)  SpO2: 95%    General: Awake, NAD.  Skin: Warm, dry. No rashes or lesions.  Eyes: PERRL. Conjunctivae normal.  CV: Good peripheral perfusion.  Resp: Normal effort.  Abd: Soft, non-tender. No distention.  Neuro: At baseline. No gross neurological deficits.  Cranial nerves II through XII intact, 5/5 strength in both extremities, EOMI, patient ambulates well with no difficulty  Focused Exam: N/A.  Physical Exam    ED Results / Procedures / Treatments   Labs (all labs ordered are listed, but only abnormal results are displayed) Labs Reviewed - No data to display   EKG N/A.   RADIOLOGY  ED Provider Interpretation: I personally viewed and interpreted this head CT, no evidence of acute intracranial abnormalities.  CT Head Wo Contrast  Result Date: 07/11/2021 CLINICAL DATA:  Headache EXAM: CT HEAD WITHOUT CONTRAST TECHNIQUE: Contiguous axial images were obtained from the base of the skull through the vertex without intravenous contrast. RADIATION DOSE REDUCTION: This exam was performed according to the departmental dose-optimization program which includes automated exposure control, adjustment of the mA and/or kV according to patient size and/or use of iterative reconstruction technique. COMPARISON:  None Available. FINDINGS: Brain: No evidence of acute infarction, hemorrhage, hydrocephalus, extra-axial collection or mass lesion/mass effect. Vascular: No hyperdense vessel or unexpected calcification. Skull: Normal. Negative for fracture or focal lesion. Sinuses/Orbits: The visualized paranasal sinuses are essentially clear. The mastoid air cells are unopacified. Other: None. IMPRESSION: Normal head CT. Electronically Signed   By: 07/13/2021 M.D.   On: 07/11/2021 21:37    PROCEDURES:  Critical Care performed: N/A.  Procedures    MEDICATIONS ORDERED IN ED: Medications - No data to display   IMPRESSION / MDM /  ASSESSMENT AND PLAN / ED COURSE  I reviewed the triage vital signs and the nursing notes.                              Differential diagnosis includes, but is not limited to, brain mass, tension headache, migraine, cluster headache, hypertension.  ED Course Patient appears well, notably hypertensive at 163/101, otherwise normal vitals.  NAD.  Assessment/Plan Patient presents with chronic headache x6 months that is frontal in nature.  Consistent with tension versus migraine headache, though may also be caused or  exacerbated by patient's underlying hypertension, particular given his occasional symptoms of blurry vision/tinnitus. Head CT reassuring no evidence of CNS mass.  Advised him to follow-up with his primary care provider in regards to blood pressure management.  We will additionally provide him with a referral to neurology for further evaluation of his chronic headaches.  Encouraged him to continue taking Tylenol/ibuprofen as needed.  We will plan to discharge  Provided the patient with anticipatory guidance, return precautions, and educational material. Encouraged the patient to return to the emergency department at any time if they begin to experience any new or worsening symptoms. Patient expressed understanding and agreed with the plan.       FINAL CLINICAL IMPRESSION(S) / ED DIAGNOSES   Final diagnoses:  Chronic nonintractable headache, unspecified headache type     Rx / DC Orders   ED Discharge Orders     None        Note:  This document was prepared using Dragon voice recognition software and may include unintentional dictation errors.   Varney Daily, [place] 07/11/21 2242    Minna Antis, MD 07/12/21 1900

## 2021-07-11 NOTE — ED Triage Notes (Signed)
Pt c/o of daily headaches for last 6 months, states he stopped seeing PCP but was given pain meds that he has since stopped taking- pain uncontrolled w/ OTC medication. Pt denies history of migraine. Pt reports occasional blurry vision. Pt denies N/V/D at this time, denies blurry vision at this time. Pt is AOX4, NAD noted.

## 2021-07-11 NOTE — ED Notes (Signed)
DC ppw provided to patient. RX info and followup information reviewed. PT questions answered. pt declines vs at time of DC. PT provides verbal consent for dc. Pt ambulatory to lobby alert and oriented x4. 

## 2021-08-08 ENCOUNTER — Other Ambulatory Visit: Payer: Self-pay

## 2021-08-08 ENCOUNTER — Encounter (HOSPITAL_COMMUNITY): Payer: Self-pay

## 2021-08-08 ENCOUNTER — Emergency Department (HOSPITAL_COMMUNITY)
Admission: EM | Admit: 2021-08-08 | Discharge: 2021-08-08 | Disposition: A | Discharge status: Home / Self Care | Payer: 59 | Attending: Emergency Medicine | Admitting: Emergency Medicine

## 2021-08-08 DIAGNOSIS — R519 Headache, unspecified: Secondary | ICD-10-CM | POA: Diagnosis present

## 2021-08-08 DIAGNOSIS — H538 Other visual disturbances: Secondary | ICD-10-CM | POA: Insufficient documentation

## 2021-08-08 DIAGNOSIS — Z79899 Other long term (current) drug therapy: Secondary | ICD-10-CM | POA: Insufficient documentation

## 2021-08-08 DIAGNOSIS — I10 Essential (primary) hypertension: Secondary | ICD-10-CM | POA: Insufficient documentation

## 2021-08-08 DIAGNOSIS — G8929 Other chronic pain: Secondary | ICD-10-CM

## 2021-08-08 DIAGNOSIS — R110 Nausea: Secondary | ICD-10-CM | POA: Diagnosis not present

## 2021-08-08 LAB — CBC
HCT: 46.7 % (ref 39.0–52.0)
Hemoglobin: 16 g/dL (ref 13.0–17.0)
MCH: 30.7 pg (ref 26.0–34.0)
MCHC: 34.3 g/dL (ref 30.0–36.0)
MCV: 89.6 fL (ref 80.0–100.0)
Platelets: 284 10*3/uL (ref 150–400)
RBC: 5.21 MIL/uL (ref 4.22–5.81)
RDW: 12.1 % (ref 11.5–15.5)
WBC: 9.3 10*3/uL (ref 4.0–10.5)
nRBC: 0 % (ref 0.0–0.2)

## 2021-08-08 LAB — BASIC METABOLIC PANEL
Anion gap: 11 (ref 5–15)
BUN: 17 mg/dL (ref 6–20)
CO2: 27 mmol/L (ref 22–32)
Calcium: 9.1 mg/dL (ref 8.9–10.3)
Chloride: 106 mmol/L (ref 98–111)
Creatinine, Ser: 1.23 mg/dL (ref 0.61–1.24)
GFR, Estimated: >60 mL/min (ref >60)
Glucose, Bld: 93 mg/dL (ref 70–99)
Potassium: 3.6 mmol/L (ref 3.5–5.1)
Sodium: 144 mmol/L (ref 135–145)

## 2021-08-08 MED ORDER — AMLODIPINE BESYLATE 5 MG PO TABS: 5.0000 mg | ORAL_TABLET | Freq: Every day | ORAL | 0 refills | Status: DC

## 2021-08-08 MED ORDER — PROCHLORPERAZINE MALEATE 5 MG PO TABS
10.0000 mg | ORAL_TABLET | Freq: Once | ORAL | Status: AC
  Administered 2021-08-08: 10 mg via ORAL
  Filled 2021-08-08: qty 2

## 2021-08-08 MED ORDER — PROCHLORPERAZINE EDISYLATE 10 MG/2ML IJ SOLN
10.0000 mg | Freq: Once | INTRAMUSCULAR | Status: DC
  Filled 2021-08-08: qty 2

## 2021-08-08 MED ORDER — ACETAMINOPHEN 500 MG PO TABS
1000.0000 mg | ORAL_TABLET | Freq: Once | ORAL | Status: AC
  Administered 2021-08-08: 1000 mg via ORAL
  Filled 2021-08-08: qty 2

## 2021-08-12 ENCOUNTER — Encounter: Payer: Self-pay | Admitting: Neurology

## 2021-09-01 ENCOUNTER — Encounter: Payer: Self-pay | Admitting: Neurology

## 2021-09-15 ENCOUNTER — Encounter: Payer: Self-pay | Admitting: Nurse Practitioner

## 2021-09-15 ENCOUNTER — Ambulatory Visit: Payer: 59 | Admitting: Nurse Practitioner

## 2021-09-15 VITALS — BP 132/76 | HR 80 | Temp 97.3°F | Resp 12 | Ht 69.0 in | Wt 184.5 lb

## 2021-09-15 DIAGNOSIS — I10 Essential (primary) hypertension: Secondary | ICD-10-CM

## 2021-09-15 DIAGNOSIS — R252 Cramp and spasm: Secondary | ICD-10-CM

## 2021-09-15 DIAGNOSIS — R1011 Right upper quadrant pain: Secondary | ICD-10-CM

## 2021-09-15 DIAGNOSIS — R739 Hyperglycemia, unspecified: Secondary | ICD-10-CM

## 2021-09-15 DIAGNOSIS — E611 Iron deficiency: Secondary | ICD-10-CM | POA: Diagnosis not present

## 2021-09-15 DIAGNOSIS — R519 Headache, unspecified: Secondary | ICD-10-CM | POA: Diagnosis not present

## 2021-09-15 DIAGNOSIS — Z7689 Persons encountering health services in other specified circumstances: Secondary | ICD-10-CM | POA: Diagnosis not present

## 2021-09-15 DIAGNOSIS — E7800 Pure hypercholesterolemia, unspecified: Secondary | ICD-10-CM

## 2021-09-15 DIAGNOSIS — I471 Supraventricular tachycardia: Secondary | ICD-10-CM

## 2021-09-15 DIAGNOSIS — K2211 Ulcer of esophagus with bleeding: Secondary | ICD-10-CM | POA: Insufficient documentation

## 2021-09-15 LAB — COMPREHENSIVE METABOLIC PANEL
ALT: 12 U/L (ref 0–53)
AST: 12 U/L (ref 0–37)
Albumin: 4.4 g/dL (ref 3.5–5.2)
Alkaline Phosphatase: 106 U/L (ref 39–117)
BUN: 16 mg/dL (ref 6–23)
CO2: 28 mEq/L (ref 19–32)
Calcium: 9 mg/dL (ref 8.4–10.5)
Chloride: 103 mEq/L (ref 96–112)
Creatinine, Ser: 1.1 mg/dL (ref 0.40–1.50)
GFR: 77.59 mL/min (ref >60.00)
Glucose, Bld: 97 mg/dL (ref 70–99)
Potassium: 4.4 mEq/L (ref 3.5–5.1)
Sodium: 139 mEq/L (ref 135–145)
Total Bilirubin: 0.4 mg/dL (ref 0.2–1.2)
Total Protein: 7.3 g/dL (ref 6.0–8.3)

## 2021-09-15 LAB — MAGNESIUM: Magnesium: 2.1 mg/dL (ref 1.5–2.5)

## 2021-09-15 LAB — IBC + FERRITIN
Ferritin: 24.3 ng/mL (ref 22.0–322.0)
Iron: 72 ug/dL (ref 42–165)
Saturation Ratios: 20.7 % (ref 20.0–50.0)
TIBC: 348.6 ug/dL (ref 250.0–450.0)
Transferrin: 249 mg/dL (ref 212.0–360.0)

## 2021-09-15 LAB — CBC
HCT: 45.8 % (ref 39.0–52.0)
Hemoglobin: 15.8 g/dL (ref 13.0–17.0)
MCHC: 34.6 g/dL (ref 30.0–36.0)
MCV: 88.6 fl (ref 78.0–100.0)
Platelets: 265 10*3/uL (ref 150.0–400.0)
RBC: 5.17 Mil/uL (ref 4.22–5.81)
RDW: 13.5 % (ref 11.5–15.5)
WBC: 8.7 10*3/uL (ref 4.0–10.5)

## 2021-09-15 LAB — LIPID PANEL
Cholesterol: 245 mg/dL — ABNORMAL HIGH (ref 0–200)
HDL: 40.6 mg/dL (ref >39.00)
LDL Cholesterol: 173 mg/dL — ABNORMAL HIGH (ref 0–99)
NonHDL: 204.67
Total CHOL/HDL Ratio: 6
Triglycerides: 160 mg/dL — ABNORMAL HIGH (ref 0.0–149.0)
VLDL: 32 mg/dL (ref 0.0–40.0)

## 2021-09-15 LAB — TSH: TSH: 2.29 u[IU]/mL (ref 0.35–5.50)

## 2021-09-15 LAB — LIPASE: Lipase: 19 U/L (ref 11.0–59.0)

## 2021-09-15 MED ORDER — ATORVASTATIN CALCIUM 20 MG PO TABS
20.0000 mg | ORAL_TABLET | Freq: Every day | ORAL | 1 refills | Status: DC
Start: 2021-09-15 — End: 2021-09-17

## 2021-09-15 MED ORDER — CYCLOBENZAPRINE HCL 5 MG PO TABS: 5.0000 mg | ORAL_TABLET | Freq: Two times a day (BID) | ORAL | 0 refills | Status: AC | PRN

## 2021-09-15 MED ORDER — AMLODIPINE BESYLATE 5 MG PO TABS
5.0000 mg | ORAL_TABLET | Freq: Every day | ORAL | 1 refills | Status: DC
Start: 2021-09-15 — End: 2022-05-13

## 2021-09-15 MED ORDER — METOPROLOL SUCCINATE ER 25 MG PO TB24: 50.0000 mg | ORAL_TABLET | Freq: Two times a day (BID) | ORAL | 5 refills | Status: DC

## 2021-09-15 NOTE — Progress Notes (Signed)
New Patient Office Visit  Subjective    Patient ID: Collin Mitchell, male    DOB: 1970-05-31  Age: 51 y.o. MRN: [id number]  CC:  Chief Complaint  Patient presents with   Establish Care    Previous PCP [facility]   Headache    X 6 months. Usually pain located in the top front head/forehead area. Every day, sharp/pressure pain.    Abdominal Pain    X 2 to 3 months. Pain on the right side after he eats and feels full.    HPI Collin Mitchell presents to establish care   HTN: states that he checks it at the pharmacy moslty 160/102 most times.  Blood pressure within normal limits in office today.  SVT: ablation and no follow up since.  Currently maintained on metoprolol.  HLD: Atrovastatin tolerates it well. 2 meals a day and all day snack. 20 oz approx 3 a day that is pepsi. Exercise: no exercise out side of employment   Abodminal pain: right sided all the time with food. Sharp stabbing. Will go away. Poops twice a day normally. No toruble with gas or bloating . Recently did a cologuard per patient report.  It was negative per his report.  Did not see in the chart but did see patient do an iFOB in the past that was negative  Headaches: x 6 months. Everyday. States that it has calmed down since getting blood pressure medication. States that he is getting them every other day. Front part of the head. No photo, sono no aura. Sharp squeezing pain. Has taken tylenol with some relief.  Colonoscopy: cologuard and iFOB PSA: do not see in the chart Shingles information given today Covid: 2 vaccines  Outpatient Encounter Medications as of 09/15/2021  Medication Sig   cyclobenzaprine (FLEXERIL) 5 MG tablet Take 1 tablet (5 mg total) by mouth 2 (two) times daily as needed for muscle spasms (headaches).   ferrous sulfate 325 (65 FE) MG tablet Take 325 mg by mouth daily with breakfast.   omeprazole (PRILOSEC) 20 MG capsule Take 20 mg by mouth daily.   [DISCONTINUED] amLODipine (NORVASC)  5 MG tablet Take 1 tablet (5 mg total) by mouth daily.   [DISCONTINUED] atorvastatin (LIPITOR) 20 MG tablet Take 1 tablet (20 mg total) by mouth daily.   [DISCONTINUED] metoprolol succinate (TOPROL-XL) 25 MG 24 hr tablet Take 50 mg by mouth 2 (two) times daily.   amLODipine (NORVASC) 5 MG tablet Take 1 tablet (5 mg total) by mouth daily.   atorvastatin (LIPITOR) 20 MG tablet Take 1 tablet (20 mg total) by mouth daily.   metoprolol succinate (TOPROL-XL) 25 MG 24 hr tablet Take 2 tablets (50 mg total) by mouth 2 (two) times daily.   No facility-administered encounter medications on file as of 09/15/2021.    Past Medical History:  Diagnosis Date   Hypercholesterolemia    Hypertension    SVT (supraventricular tachycardia) ([facility])    s/p ablation    Past Surgical History:  Procedure Laterality Date   CARDIAC ELECTROPHYSIOLOGY MAPPING AND ABLATION     EYE SURGERY     was cross eyed and this was done when patient was aroun 51 years of age    Family History  Problem Relation Age of Onset   Hypertension Mother    Valvular heart disease Mother    CAD Mother 79   COPD Father    Supraventricular tachycardia Sister        s/p ablation  Stroke Sister        mini strokes   CAD Maternal Grandmother     Social History   Socioeconomic History   Marital status: Married    Spouse name: Not on file   Number of children: 4   Years of education: Not on file   Highest education level: High school graduate  Occupational History   Occupation: manual labor  Tobacco Use   Smoking status: Never    Passive exposure: Past   Smokeless tobacco: Never  Vaping Use   Vaping Use: Never used  Substance and Sexual Activity   Alcohol use: No   Drug use: No   Sexual activity: Not on file  Other Topics Concern   Not on file  Social History Narrative   Fulltime: Administrator, 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 Negative for chills and fever.  Respiratory:  Negative for shortness of breath.   Cardiovascular:  Negative for chest pain and leg swelling.  Gastrointestinal:  Negative for abdominal pain, nausea and vomiting.  Genitourinary:  Negative for dysuria and hematuria.  Neurological:  Positive for headaches. Negative for dizziness and tingling.  Psychiatric/Behavioral:  Negative for hallucinations and suicidal ideas.         Objective    BP 132/76   Pulse 80   Temp (!) 97.3 F (36.3 C)   Resp 12   Ht 5\' 9"  (1.753 m)   Wt 184 lb 8 oz (83.7 kg)   SpO2 95%   BMI 27.25 kg/m   Physical Exam Vitals and nursing note reviewed.  Constitutional:      Appearance: Normal appearance. He is well-developed.  HENT:     Right Ear: Tympanic membrane, ear canal and external ear normal.     Left Ear: Tympanic membrane, ear canal and external ear normal.     Mouth/Throat:     Mouth: Mucous membranes are moist.     Pharynx: Oropharynx is clear.  Eyes:     Extraocular Movements: Extraocular movements intact.     Pupils: Pupils are equal, round, and reactive to light.     Comments: Wears corrective lenses  Cardiovascular:     Rate and Rhythm: Normal rate and regular rhythm.     Pulses: Normal pulses.     Heart sounds: Normal heart sounds.  Pulmonary:     Effort: Pulmonary effort is normal.     Breath sounds: Normal breath sounds.  Abdominal:     General: Bowel sounds are normal. There is no distension.     Palpations: There is no mass.     Tenderness: There is abdominal tenderness.     Hernia: No hernia is present.    Musculoskeletal:       Arms:     Right lower leg: No edema.     Left lower leg: No edema.  Lymphadenopathy:     Cervical: No  cervical adenopathy.  Skin:    General: Skin is warm.  Neurological:     General: No focal deficit present.     Mental Status: He is alert.     Deep Tendon Reflexes:     Reflex  Scores:      Bicep reflexes are 2+ on the right side and 2+ on the left side.      Patellar reflexes are 2+ on the right side and 2+ on the left side.    Comments: Bilateral upper and lower extremity strength 5/5         Assessment & Plan:   Problem List Items Addressed This Visit       Cardiovascular and Mediastinum   SVT (supraventricular tachycardia) (HCC)    Status post ablation. Follow cardiology.  Patient currently maintained on metoprolol.  Continue medication as prescribed      Relevant Medications   amLODipine (NORVASC) 5 MG tablet   atorvastatin (LIPITOR) 20 MG tablet   metoprolol succinate (TOPROL-XL) 25 MG 24 hr tablet   Hypertension    Patient currently maintained on amlodipine and metoprolol.  Blood pressure within normal limits today.  Continue taking medications as prescribed.      Relevant Medications   amLODipine (NORVASC) 5 MG tablet   atorvastatin (LIPITOR) 20 MG tablet   metoprolol succinate (TOPROL-XL) 25 MG 24 hr tablet   Other Relevant Orders   Comprehensive metabolic panel   TSH     Other   Hypercholesterolemia   Relevant Medications   amLODipine (NORVASC) 5 MG tablet   atorvastatin (LIPITOR) 20 MG tablet   metoprolol succinate (TOPROL-XL) 25 MG 24 hr tablet   Other Relevant Orders   Lipid panel   Comprehensive metabolic panel   Hyperglycemia    Patient currently maintained on atorvastatin.  Continue medication as prescribed pending labs.      Relevant Orders   Comprehensive metabolic panel   Frequent headaches    Patient's been seen several times in the emergency department for headaches.  He does have referral out there for neurology but does not see them until November.  He is on a beta-blocker recent CT scan of patient's head was reviewed and negative.   Patient did have a lot of muscle tightness on the right side we will trial Flexeril 5 mg twice daily as needed.  Sedation precautions reviewed with patient.  Encouraged patient to keep neurology appointment.      Relevant Medications   amLODipine (NORVASC) 5 MG tablet   metoprolol succinate (TOPROL-XL) 25 MG 24 hr tablet   cyclobenzaprine (FLEXERIL) 5 MG tablet   Other Relevant Orders   Comprehensive metabolic panel   TSH   Abdominal pain, right upper quadrant - Primary    Abnormal presentation for gallbladder pain.  But it is after eating and it is more right middle to lower on the side/flank.  We will check basic labs today.  If negative consider ultrasound of abdomen.  Did discuss this with patient      Relevant Orders   CBC   Lipase   Comprehensive metabolic panel   Muscle cramping    Patient did mention that he has been having some muscle cramping.  He has been on PPI for extended period of time will check magnesium in office.  Pending result      Relevant Orders   Comprehensive metabolic panel   TSH   Magnesium   Iron deficiency    Patient currently being replaced with oral iron.  Pending labs today      Relevant Orders   Comprehensive metabolic panel   IBC + Ferritin   Other Visit Diagnoses     Encounter to establish care       Relevant Orders  Comprehensive metabolic panel       Return in about 6 months (around 03/18/2022) for CPE and labs.   Audria Nine, NP

## 2021-09-15 NOTE — Assessment & Plan Note (Signed)
Patient currently being replaced with oral iron.  Pending labs today

## 2021-09-15 NOTE — Patient Instructions (Signed)
Nice to see you today I want to see you in 6 months for your physical, sooner if you need me I will be in touch with the labs once I have them Depending on the results we will get a ultrasound of the abdomen, but I will be in touch prior to scheduling that.

## 2021-09-15 NOTE — Assessment & Plan Note (Signed)
Patient currently maintained on amlodipine and metoprolol.  Blood pressure within normal limits today.  Continue taking medications as prescribed.

## 2021-09-15 NOTE — Assessment & Plan Note (Signed)
Status post ablation. Follow cardiology.  Patient currently maintained on metoprolol.  Continue medication as prescribed

## 2021-09-15 NOTE — Assessment & Plan Note (Signed)
Patient currently maintained on atorvastatin.  Continue medication as prescribed pending labs.

## 2021-09-15 NOTE — Assessment & Plan Note (Signed)
Abnormal presentation for gallbladder pain.  But it is after eating and it is more right middle to lower on the side/flank.  We will check basic labs today.  If negative consider ultrasound of abdomen.  Did discuss this with patient

## 2021-09-15 NOTE — Assessment & Plan Note (Signed)
Patient's been seen several times in the emergency department for headaches.  He does have referral out there for neurology but does not see them until November.  He is on a beta-blocker recent CT scan of patient's head was reviewed and negative.  Patient did have a lot of muscle tightness on the right side we will trial Flexeril 5 mg twice daily as needed.  Sedation precautions reviewed with patient.  Encouraged patient to keep neurology appointment.

## 2021-09-15 NOTE — Assessment & Plan Note (Signed)
Patient did mention that he has been having some muscle cramping.  He has been on PPI for extended period of time will check magnesium in office.  Pending result

## 2021-09-17 ENCOUNTER — Telehealth: Payer: Self-pay | Admitting: Nurse Practitioner

## 2021-09-17 DIAGNOSIS — E7800 Pure hypercholesterolemia, unspecified: Secondary | ICD-10-CM

## 2021-09-17 DIAGNOSIS — R1011 Right upper quadrant pain: Secondary | ICD-10-CM

## 2021-09-17 MED ORDER — ATORVASTATIN CALCIUM 40 MG PO TABS: 40.0000 mg | ORAL_TABLET | Freq: Every day | ORAL | 3 refills | Status: DC

## 2021-09-17 NOTE — Telephone Encounter (Signed)
-----   Message from [facility], [place] sent at 09/16/2021  3:54 PM EDT ----- Patient advised. Patient agrees to increasing dose of medication. Lab appt set up for 12/22/21. Patient agrees to proceed with [place] and to please use [facility] location in [place]. Patient is aware of their information.

## 2021-09-17 NOTE — Telephone Encounter (Signed)
[place] order placed. Updated atorvastatin medication sent to pharmacy on file and labs placed for future

## 2021-09-26 ENCOUNTER — Ambulatory Visit
Admission: RE | Admit: 2021-09-26 | Discharge: 2021-09-26 | Disposition: A | Discharge status: Home / Self Care | Payer: 59 | Source: Ambulatory Visit | Attending: Nurse Practitioner | Admitting: Nurse Practitioner

## 2021-09-26 DIAGNOSIS — R1011 Right upper quadrant pain: Secondary | ICD-10-CM

## 2021-09-29 ENCOUNTER — Telehealth: Payer: Self-pay | Admitting: Nurse Practitioner

## 2021-09-29 NOTE — Telephone Encounter (Signed)
Patient advised of [place] results, see result notes.

## 2021-09-29 NOTE — Telephone Encounter (Signed)
Patient called in returning a call he received. Thank you! 

## 2021-09-30 ENCOUNTER — Telehealth: Payer: Self-pay | Admitting: Nurse Practitioner

## 2021-09-30 DIAGNOSIS — R1011 Right upper quadrant pain: Secondary | ICD-10-CM

## 2021-09-30 NOTE — Telephone Encounter (Signed)
-----   Message from [facility], [place] sent at 09/29/2021 12:57 PM EDT ----- Patient advised. Patient would like to go to [facility] location. Advised that referral will be sent to [facility] GI office and that patient can go ahead and call their office directly instead of waiting for a call from them and did advise it will take sometime to get in to be seen most likely

## 2021-09-30 NOTE — Telephone Encounter (Signed)
Referral placed.

## 2021-11-06 ENCOUNTER — Encounter: Payer: Self-pay | Admitting: Gastroenterology

## 2021-12-16 ENCOUNTER — Encounter: Payer: Self-pay | Admitting: Gastroenterology

## 2021-12-16 ENCOUNTER — Ambulatory Visit (INDEPENDENT_AMBULATORY_CARE_PROVIDER_SITE_OTHER): Payer: 59 | Admitting: Gastroenterology

## 2021-12-16 ENCOUNTER — Other Ambulatory Visit: Payer: 59

## 2021-12-16 VITALS — BP 116/80 | HR 79 | Ht 69.0 in | Wt 186.5 lb

## 2021-12-16 DIAGNOSIS — Z1211 Encounter for screening for malignant neoplasm of colon: Secondary | ICD-10-CM

## 2021-12-16 DIAGNOSIS — K219 Gastro-esophageal reflux disease without esophagitis: Secondary | ICD-10-CM

## 2021-12-16 DIAGNOSIS — R197 Diarrhea, unspecified: Secondary | ICD-10-CM | POA: Diagnosis not present

## 2021-12-16 DIAGNOSIS — R1011 Right upper quadrant pain: Secondary | ICD-10-CM | POA: Diagnosis not present

## 2021-12-16 DIAGNOSIS — R194 Change in bowel habit: Secondary | ICD-10-CM

## 2021-12-16 DIAGNOSIS — K2270 Barrett's esophagus without dysplasia: Secondary | ICD-10-CM | POA: Diagnosis not present

## 2021-12-16 MED ORDER — NA SULFATE-K SULFATE-MG SULF 17.5-3.13-1.6 GM/177ML PO SOLN: 1.0000 | Freq: Once | ORAL | 0 refills | Status: AC

## 2021-12-16 NOTE — Progress Notes (Unsigned)
Chief Complaint: Abdominal pain, GERD, diarrhea, change in bowel habits, dysphagia   Referring Provider:     Michela Pitcher, NP   HPI:     Collin Mitchell is a 51 y.o. male with a history of HTN, HLD, SVT  s/p ablation, headaches, GERD with Barrett's Esophagus, referred to the Gastroenterology Clinic for evaluation of abdominal pain and GERD.  Reports having abdominal pain for the last 5-6 months.  Predominantly RUQ/right flank and only after eating.  Described as a sharp, stabbing pain.  Independent of food type. Starts ~20 mins after eating. No prior similar sxs. Occasional nausea, but no emesis. No associated melena, hematochezia. Was seen by his PCM for this issue on 09/15/2021. - 09/15/2021: Normal CBC, CMP, lipase, TSH, iron panel - 09/26/2021: Abdominal ultrasound: Normal   Separately, longstanding history of GERD with previously diagnosed nondysplastic Barrett's Esophagus.  Prescribed with Prilosec 20 mg/day, but takes prn. Takes ~2 times/week after dinner. Index sxs of HB.  Now also with dysphagia to solids and pills, pointing to suprasternal notch for the last 6 months. Occasionally regurgitates food/pills back out.   - EGD at [facility] 04/16/2017: Gastric folds at 40 cm, Z-line at 36 cm with 4 cm of salmon-colored mucosa (path: Nondysplastic Barrett's Esophagus). <1 cm nodular area at 39 cm (path: Superficial gastritis without metaplasia/dysplasia).  Normal stomach (path: Non-H. pylori gastritis).  Many nonbleeding superficial duodenal ulcers in duodenal bulb measuring up to 6 mm.  Recommended NSAID avoidance and start daily PPI - No EGD prior or since.   Lastly, also with water, loose stools daily x6 months. 3 BM.day (from baseline 2 formed stools/day). No nocturnal stools. No meds, dietary mods, fiber trialed.   No previous colonoscopy.  iFOBT negative in 11/2019.  Patient reports a completing Cologuard at some point since then, but never received any results.   No known  family history of CRC, GI malignancy, liver disease, pancreatic disease, or IBD.     Past Medical History:  Diagnosis Date   Hypercholesterolemia    Hypertension    SVT (supraventricular tachycardia) (HCC)    s/p ablation     Past Surgical History:  Procedure Laterality Date   CARDIAC ELECTROPHYSIOLOGY MAPPING AND ABLATION     EYE SURGERY     was cross eyed and this was done when patient was aroun 51 years of age   Family History  Problem Relation Age of Onset   Hypertension Mother    Valvular heart disease Mother    CAD Mother 61   COPD Father    Supraventricular tachycardia Sister        s/p ablation   Stroke Sister        mini strokes   CAD Maternal Grandmother    Social History   Tobacco Use   Smoking status: Never    Passive exposure: Past   Smokeless tobacco: Never  Vaping Use   Vaping Use: Never used  Substance Use Topics   Alcohol use: No   Drug use: No   Current Outpatient Medications  Medication Sig Dispense Refill   amLODipine (NORVASC) 5 MG tablet Take 1 tablet (5 mg total) by mouth daily. 90 tablet 1   atorvastatin (LIPITOR) 40 MG tablet Take 1 tablet (40 mg total) by mouth daily. 90 tablet 3   cyclobenzaprine (FLEXERIL) 5 MG tablet Take 1 tablet (5 mg total) by mouth 2 (two) times daily as needed for  muscle spasms (headaches). 30 tablet 0   ferrous sulfate 325 (65 FE) MG tablet Take 325 mg by mouth daily with breakfast.     metoprolol succinate (TOPROL-XL) 25 MG 24 hr tablet Take 2 tablets (50 mg total) by mouth 2 (two) times daily. 120 tablet 5   omeprazole (PRILOSEC) 20 MG capsule Take 20 mg by mouth daily.     No current facility-administered medications for this visit.   Allergies  Allergen Reactions   Penicillins Hives     Review of Systems: All systems reviewed and negative except where noted in HPI.     Physical Exam:    Wt Readings from Last 3 Encounters:  09/15/21 184 lb 8 oz (83.7 kg)  08/08/21 186 lb 1.1 oz (84.4 kg)   07/11/21 186 lb (84.4 kg)    There were no vitals taken for this visit. Constitutional:  Pleasant, in no acute distress. Psychiatric: Normal mood and affect. Behavior is normal. Cardiovascular: Normal rate, regular rhythm. No edema Pulmonary/chest: Effort normal and breath sounds normal. No wheezing, rales or rhonchi. Abdominal: Soft, nondistended, nontender. Bowel sounds active throughout. There are no masses palpable. No hepatomegaly. Neurological: Alert and oriented to person place and time. Skin: Skin is warm and dry. No rashes noted.   ASSESSMENT AND PLAN;   1) RUQ pain - HIDA scan - Evaluate for medical/luminal pathology with EGD  2) GERD 3) Nondysplastic Barrett's Esophagus - EGD for Barrett's Esophagus surveillance - Currently only taking Prilosec prn.  Will evaluate for erosive esophagitis at time of upper endoscopy -Continue antireflux lifestyle/dietary modifications with avoidance of exacerbating foods  4) Dysphagia - Evaluate for esophageal stricture, ring, luminal narrowing, etc. time EGD with esophageal dilation and biopsies as appropriate - Cut food into small pieces, eat small bites, chew food thoroughly and with plenty of liquids to avoid food impaction. - If EGD unrevealing, plan for Esophageal Manometry  5) Diarrhea 6) Change in bowel habits - EGD with biopsies - Colonoscopy with random and directed biopsies to evaluate for MC, IBD - Check fecal pancreatic elastase, GI PCR panel, fecal calprotectin  7) Colon cancer screening - Colonoscopy as above -Hold oral iron 7 days prior to starting bowel progression   The indications, risks, and benefits of EGD and colonoscopy were explained to the patient in detail. Risks include but are not limited to bleeding, perforation, adverse reaction to medications, and cardiopulmonary compromise. Sequelae include but are not limited to the possibility of surgery, hospitalization, and mortality. The patient verbalized  understanding and wished to proceed. All questions answered, referred to scheduler and bowel prep ordered. Further recommendations pending results of the exam.     Shellia Cleverly, DO, FACG  12/16/2021, 4:00 PM   Toney Reil Genene Churn, NP

## 2021-12-16 NOTE — Patient Instructions (Addendum)
Hold Iron 7 days prior to procedure  Your provider has requested that you go to the basement level for lab work before leaving today. Press "B" on the elevator. The lab is located at the first door on the left as you exit the elevator.   We have sent the following medications to your pharmacy for you to pick up at your convenience: [facility] will be contacted by [place] in the next 2 days to arrange a HIDA Scan  The number on your caller ID will be [phone], please answer when they call.  If you have not heard from them in 2 days please call [phone] to schedule.     You have been scheduled for a HIDA scan at [facility] (1st floor) on __________. Please arrive 30 minutes prior to your scheduled appointment at  _____________. Make certain not to have anything to eat or drink at least 6 hours prior to your test. Should this appointment date or time not work well for you, please call radiology scheduling at [phone].  _____________________________________________________________________ hepatobiliary (HIDA) scan is an imaging procedure used to diagnose problems in the liver, gallbladder and bile ducts. In the HIDA scan, a radioactive chemical or tracer is injected into a vein in your arm. The tracer is handled by the liver like bile. Bile is a fluid produced and excreted by your liver that helps your digestive system break down fats in the foods you eat. Bile is stored in your gallbladder and the gallbladder releases the bile when you eat a meal. A special nuclear medicine scanner (gamma camera) tracks the flow of the tracer from your liver into your gallbladder and small intestine.  During your HIDA scan  You'll be asked to change into a hospital gown before your HIDA scan begins. Your health care team will position you on a table, usually on your back. The radioactive tracer is then injected into a vein in your arm.The tracer travels through your bloodstream  to your liver, where it's taken up by the bile-producing cells. The radioactive tracer travels with the bile from your liver into your gallbladder and through your bile ducts to your small intestine.You may feel some pressure while the radioactive tracer is injected into your vein. As you lie on the table, a special gamma camera is positioned over your abdomen taking pictures of the tracer as it moves through your body. The gamma camera takes pictures continually for about an hour. You'll need to keep still during the HIDA scan. This can become uncomfortable, but you may find that you can lessen the discomfort by taking deep breaths and thinking about other things. Tell your health care team if you're uncomfortable. The radiologist will watch on a computer the progress of the radioactive tracer through your body. The HIDA scan may be stopped when the radioactive tracer is seen in the gallbladder and enters your small intestine. This typically takes about an hour. In some cases extra imaging will be performed if original images aren't satisfactory, if morphine is given to help visualize the gallbladder or if the medication CCK is given to look at the contraction of the gallbladder. This test typically takes 2 hours to complete.  Thank you for choosing me and [facility].  Gerrit Heck, D.O.  ________________________________________________________________________

## 2021-12-18 ENCOUNTER — Encounter: Payer: Self-pay | Admitting: Gastroenterology

## 2021-12-22 ENCOUNTER — Other Ambulatory Visit (INDEPENDENT_AMBULATORY_CARE_PROVIDER_SITE_OTHER): Payer: 59

## 2021-12-22 DIAGNOSIS — E7800 Pure hypercholesterolemia, unspecified: Secondary | ICD-10-CM

## 2021-12-22 LAB — LIPID PANEL
Cholesterol: 201 mg/dL — ABNORMAL HIGH (ref 0–200)
HDL: 42.6 mg/dL (ref >39.00)
LDL Cholesterol: 138 mg/dL — ABNORMAL HIGH (ref 0–99)
NonHDL: 158.52
Total CHOL/HDL Ratio: 5
Triglycerides: 103 mg/dL (ref 0.0–149.0)
VLDL: 20.6 mg/dL (ref 0.0–40.0)

## 2021-12-22 LAB — HEPATIC FUNCTION PANEL
ALT: 13 U/L (ref 0–53)
AST: 17 U/L (ref 0–37)
Albumin: 4.1 g/dL (ref 3.5–5.2)
Alkaline Phosphatase: 118 U/L — ABNORMAL HIGH (ref 39–117)
Bilirubin, Direct: 0.1 mg/dL (ref 0.0–0.3)
Total Bilirubin: 0.8 mg/dL (ref 0.2–1.2)
Total Protein: 7 g/dL (ref 6.0–8.3)

## 2021-12-23 ENCOUNTER — Encounter: Payer: Self-pay | Admitting: Certified Registered Nurse Anesthetist

## 2021-12-23 NOTE — Progress Notes (Deleted)
NEUROLOGY CONSULTATION NOTE  PRATEEK KNIPPLE MRN: [id number] DOB: [birth year]  Referring provider: Alvira Monday, MD (ED referral) Primary care provider: Mordecai Maes, NP  Reason for consult:  headaches  Assessment/Plan:   ***   Subjective:  Collin Mitchell is a 51 year old ***-handed male with HTN, atrial fibrillation and history of SVT s/p ablation who presents for headaches.  History supplemented by ED notes.  Onset of persistent daily headache around *** that has gradually worsened.  Describes a sharp bifrontal headache about *** severity but worse at night.  Associated nausea.  Sometimes blurred vision but no double vision or other visual disturbance.   He denies visual disturbance, vomiting, photophobia, phonophobia, unilateral numbness or weakness.  No preceding head trauma, new medication or preceding illness.  ***.  Seen in the ED on 07/11/2021 where CT head personally reviewed was unremarkable.  Returned to the ED on two subsequent occasions where he declined MRI.    Past NSAIDS/analgesics:  *** Past abortive triptans:  *** Past abortive ergotamine:  *** Past muscle relaxants:  *** Past anti-emetic:  *** Past antihypertensive medications:  *** Past antidepressant medications:  *** Past anticonvulsant medications:  *** Past anti-CGRP:  *** Past vitamins/Herbal/Supplements:  *** Past antihistamines/decongestants:  *** Other past therapies:  ***  Current NSAIDS/analgesics:  *** Current triptans:  none Current ergotamine:  none Current anti-emetic:  none Current muscle relaxants:  cyclobenzaprine 5mg  BID PRN Current Antihypertensive medications:  metoprolol succinate 50mg  BID, amlodipine 5mg  daily Current Antidepressant medications:  none Current Anticonvulsant medications:  none Current anti-CGRP:  none    Caffeine:  *** Alcohol:  *** Smoker:  *** Diet:  *** Exercise:  *** Depression:  ***; Anxiety:  *** Other pain:  *** Sleep hygiene:  *** Family  history of headache:  ***       PAST MEDICAL HISTORY: Past Medical History:  Diagnosis Date   Hypercholesterolemia    Hypertension    SVT (supraventricular tachycardia)    s/p ablation    PAST SURGICAL HISTORY: Past Surgical History:  Procedure Laterality Date   CARDIAC ELECTROPHYSIOLOGY MAPPING AND ABLATION     EYE SURGERY     was cross eyed and this was done when patient was aroun 51 years of age    MEDICATIONS: Current Outpatient Medications on File Prior to Visit  Medication Sig Dispense Refill   amLODipine (NORVASC) 5 MG tablet Take 1 tablet (5 mg total) by mouth daily. 90 tablet 1   atorvastatin (LIPITOR) 40 MG tablet Take 1 tablet (40 mg total) by mouth daily. 90 tablet 3   cyclobenzaprine (FLEXERIL) 5 MG tablet Take 1 tablet (5 mg total) by mouth 2 (two) times daily as needed for muscle spasms (headaches). 30 tablet 0   ferrous sulfate 325 (65 FE) MG tablet Take 325 mg by mouth daily with breakfast.     metoprolol succinate (TOPROL-XL) 25 MG 24 hr tablet Take 2 tablets (50 mg total) by mouth 2 (two) times daily. 120 tablet 5   omeprazole (PRILOSEC) 20 MG capsule Take 20 mg by mouth daily.     No current facility-administered medications on file prior to visit.    ALLERGIES: Allergies  Allergen Reactions   Penicillins Hives    FAMILY HISTORY: Family History  Problem Relation Age of Onset   Hypertension Mother    Valvular heart disease Mother    CAD Mother 7   COPD Father    Supraventricular tachycardia Sister  s/p ablation   Stroke Sister        mini strokes   CAD Maternal Grandmother     Objective:  *** General: No acute distress.  Patient appears well-groomed.   Head:  Normocephalic/atraumatic Eyes:  fundi examined but not visualized Neck: supple, no paraspinal tenderness, full range of motion Back: No paraspinal tenderness Heart: regular rate and rhythm Lungs: Clear to auscultation bilaterally. Vascular: No carotid bruits. Neurological  Exam: Mental status: alert and oriented to person, place, and time, speech fluent and not dysarthric, language intact. Cranial nerves: CN I: not tested CN II: pupils equal, round and reactive to light, visual fields intact CN III, IV, VI:  full range of motion, no nystagmus, no ptosis CN V: facial sensation intact. CN VII: upper and lower face symmetric CN VIII: hearing intact CN IX, X: gag intact, uvula midline CN XI: sternocleidomastoid and trapezius muscles intact CN XII: tongue midline Bulk & Tone: normal, no fasciculations. Motor:  muscle strength 5/5 throughout Sensation:  Pinprick, temperature and vibratory sensation intact. Deep Tendon Reflexes:  2+ throughout,  toes downgoing.   Finger to nose testing:  Without dysmetria.   Heel to shin:  Without dysmetria.   Gait:  Normal station and stride.  Romberg negative.    Thank you for allowing me to take part in the care of this patient.  Metta Clines, DO  CC: Karl Ito, NP

## 2021-12-24 ENCOUNTER — Ambulatory Visit: Payer: 59 | Admitting: Neurology

## 2021-12-29 ENCOUNTER — Ambulatory Visit (AMBULATORY_SURGERY_CENTER): Payer: 59 | Admitting: Gastroenterology

## 2021-12-29 ENCOUNTER — Encounter: Payer: Self-pay | Admitting: Gastroenterology

## 2021-12-29 VITALS — BP 108/86 | HR 68 | Temp 98.9°F | Resp 11 | Ht 69.0 in | Wt 186.0 lb

## 2021-12-29 DIAGNOSIS — K529 Noninfective gastroenteritis and colitis, unspecified: Secondary | ICD-10-CM | POA: Diagnosis present

## 2021-12-29 DIAGNOSIS — R194 Change in bowel habit: Secondary | ICD-10-CM

## 2021-12-29 DIAGNOSIS — K2270 Barrett's esophagus without dysplasia: Secondary | ICD-10-CM

## 2021-12-29 DIAGNOSIS — K219 Gastro-esophageal reflux disease without esophagitis: Secondary | ICD-10-CM | POA: Diagnosis not present

## 2021-12-29 DIAGNOSIS — R1011 Right upper quadrant pain: Secondary | ICD-10-CM

## 2021-12-29 DIAGNOSIS — K449 Diaphragmatic hernia without obstruction or gangrene: Secondary | ICD-10-CM

## 2021-12-29 DIAGNOSIS — R197 Diarrhea, unspecified: Secondary | ICD-10-CM

## 2021-12-29 MED ORDER — SODIUM CHLORIDE 0.9 % IV SOLN: 500.0000 mL | Freq: Once | INTRAVENOUS | Status: DC

## 2021-12-29 NOTE — Patient Instructions (Signed)
Await pathology results.  Handout on Heartburn/hiatal hernia/GERD provided.  YOU HAD AN ENDOSCOPIC PROCEDURE TODAY AT THE [facility]:   Refer to the procedure report that was given to you for any specific questions about what was found during the examination.  If the procedure report does not answer your questions, please call your gastroenterologist to clarify.  If you requested that your care partner not be given the details of your procedure findings, then the procedure report has been included in a sealed envelope for you to review at your convenience later.  YOU SHOULD EXPECT: Some feelings of bloating in the abdomen. Passage of more gas than usual.  Walking can help get rid of the air that was put into your GI tract during the procedure and reduce the bloating. If you had a lower endoscopy (such as a colonoscopy or flexible sigmoidoscopy) you may notice spotting of blood in your stool or on the toilet paper. If you underwent a bowel prep for your procedure, you may not have a normal bowel movement for a few days.  Please Note:  You might notice some irritation and congestion in your nose or some drainage.  This is from the oxygen used during your procedure.  There is no need for concern and it should clear up in a day or so.  SYMPTOMS TO REPORT IMMEDIATELY:  Following lower endoscopy (colonoscopy or flexible sigmoidoscopy):  Excessive amounts of blood in the stool  Significant tenderness or worsening of abdominal pains  Swelling of the abdomen that is new, acute  Fever of 100F or higher  Following upper endoscopy (EGD)  Vomiting of blood or coffee ground material  New chest pain or pain under the shoulder blades  Painful or persistently difficult swallowing  New shortness of breath  Fever of 100F or higher  Black, tarry-looking stools  For urgent or emergent issues, a gastroenterologist can be reached at any hour by calling [phone]. Do not use MyChart messaging  for urgent concerns.    DIET:  We do recommend a small meal at first, but then you may proceed to your regular diet.  Drink plenty of fluids but you should avoid alcoholic beverages for 24 hours.  ACTIVITY:  You should plan to take it easy for the rest of today and you should NOT DRIVE or use heavy machinery until tomorrow (because of the sedation medicines used during the test).    FOLLOW UP: Our staff will call the number listed on your records the next business day following your procedure.  We will call around 7:15- 8:00 am to check on you and address any questions or concerns that you may have regarding the information given to you following your procedure. If we do not reach you, we will leave a message.     If any biopsies were taken you will be contacted by phone or by letter within the next 1-3 weeks.  Please call us at [phone] if you have not heard about the biopsies in 3 weeks.    SIGNATURES/CONFIDENTIALITY: You and/or your care partner have signed paperwork which will be entered into your electronic medical record.  These signatures attest to the fact that that the information above on your After Visit Summary has been reviewed and is understood.  Full responsibility of the confidentiality of this discharge information lies with you and/or your care-partner.

## 2021-12-29 NOTE — Op Note (Signed)
[facility] Patient Name: Collin Mitchell Procedure Date: 12/29/2021 2:59 PM MRN: [id number] Endoscopist: Doristine Locks , MD, [id number] Age: 51 Referring MD:  Date of Birth: 03/27/1970 Gender: Male Account #: [id number] Procedure:                Colonoscopy Indications:              Abdominal pain in the right upper quadrant,                            Clinically significant diarrhea of unexplained                            origin, Change in bowel habits Medicines:                Monitored Anesthesia Care Procedure:                Pre-Anesthesia Assessment:                           - Prior to the procedure, a History and Physical                            was performed, and patient medications and                            allergies were reviewed. The patient's tolerance of                            previous anesthesia was also reviewed. The risks                            and benefits of the procedure and the sedation                            options and risks were discussed with the patient.                            All questions were answered, and informed consent                            was obtained. Prior Anticoagulants: The patient has                            taken no anticoagulant or antiplatelet agents. ASA                            Grade Assessment: II - A patient with mild systemic                            disease. After reviewing the risks and benefits,                            the patient was deemed in satisfactory condition to  undergo the procedure.                           After obtaining informed consent, the colonoscope                            was passed under direct vision. Throughout the                            procedure, the patient's blood pressure, pulse, and                            oxygen saturations were monitored continuously. The                            CF [id number] #[id number] was introduced  through the anus                            and advanced to the the cecum, identified by                            appendiceal orifice and ileocecal valve. The                            colonoscopy was performed without difficulty. The                            patient tolerated the procedure well. The quality                            of the bowel preparation was fair. The ileocecal                            valve, appendiceal orifice, and rectum were                            photographed. Scope In: 3:32:05 PM Scope Out: 3:41:57 PM Scope Withdrawal Time: 0 hours 8 minutes 27 seconds  Total Procedure Duration: 0 hours 9 minutes 52 seconds  Findings:                 The perianal and digital rectal examinations were                            normal.                           A moderate amount of semi-solid stool was found in                            the rectum, in the sigmoid colon, in the proximal                            transverse colon, in the ascending colon and in the  cecum, interfering with visualization. Lavage of                            the area was performed using copious amounts of tap                            water, resulting in clearance with fair                            visualization. Cannot rule out the presence of                            small or flat polyps in those areas.                           The visualized mucosa was otherwise normal                            throughout the colon. Biopsies for histology were                            taken with a cold forceps from the right colon and                            left colon for evaluation of microscopic colitis.                            Estimated blood loss was minimal.                           The retroflexed view of the distal rectum and anal                            verge was normal and showed no anal or rectal                             abnormalities. Complications:            No immediate complications. Estimated Blood Loss:     Estimated blood loss was minimal. Impression:               - Preparation of the colon was fair.                           - Stool in the rectum, in the sigmoid colon, in the                            proximal transverse colon, in the ascending colon                            and in the cecum.                           - Normal mucosa in the remainder of the visualized  colon. Biopsied.                           - The distal rectum and anal verge are normal on                            retroflexion view. Recommendation:           - Patient has a contact number available for                            emergencies. The signs and symptoms of potential                            delayed complications were discussed with the                            patient. Return to normal activities tomorrow.                            Written discharge instructions were provided to the                            patient.                           - Resume previous diet.                           - Continue present medications.                           - Await pathology results.                           - Repeat colonoscopy in 1 year for routine colon                            cancer screening purposes because the bowel                            preparation was suboptimal.                           - Extended 2-day bowel preparation at the time of                            repeat colonoscopy.                           - Return to GI clinic PRN. Doristine LocksVito Amiri Tritch, MD 12/29/2021 3:56:19 PM

## 2021-12-29 NOTE — Op Note (Signed)
[facility] Patient Name: Collin Mitchell Procedure Date: 12/29/2021 3:05 PM MRN: [id number] Endoscopist: Doristine Locks , MD, [id number] Age: 51 Referring MD:  Date of Birth: 1970/04/20 Gender: Male Account #: [id number] Procedure:                Upper GI endoscopy Indications:              Abdominal pain in the right upper quadrant,                            Esophageal reflux, Surveillance due to personal                            history of Barrett's esophagus, Diarrhea Medicines:                Monitored Anesthesia Care Procedure:                Pre-Anesthesia Assessment:                           - Prior to the procedure, a History and Physical                            was performed, and patient medications and                            allergies were reviewed. The patient's tolerance of                            previous anesthesia was also reviewed. The risks                            and benefits of the procedure and the sedation                            options and risks were discussed with the patient.                            All questions were answered, and informed consent                            was obtained. Prior Anticoagulants: The patient has                            taken no anticoagulant or antiplatelet agents. ASA                            Grade Assessment: II - A patient with mild systemic                            disease. After reviewing the risks and benefits,                            the patient was deemed in satisfactory condition to  undergo the procedure.                           After obtaining informed consent, the endoscope was                            passed under direct vision. Throughout the                            procedure, the patient's blood pressure, pulse, and                            oxygen saturations were monitored continuously. The                            GIF [id number] #[id number]  was introduced through the                            mouth, and advanced to the second part of duodenum.                            The upper GI endoscopy was accomplished without                            difficulty. The patient tolerated the procedure                            well. Scope In: Scope Out: Findings:                 The esophagus and gastroesophageal junction were                            examined with white light and narrow band imaging                            (NBI). There were esophageal mucosal changes                            classified as Barrett's stage C4-M4 per Prague                            criteria. These changes involved the mucosa at the                            upper extent of the gastric folds (40 cm from the                            incisors) extending to the Z-line (36 cm from the                            incisors). The maximum longitudinal extent of these  esophageal mucosal changes was 4 cm in length.                            Mucosa was biopsied with a cold forceps for                            histology in 4 quadrants at intervals of 2 cm. A                            total of 3 specimen bottles were sent to pathology.                            Estimated blood loss was minimal.                           A 2 cm hiatal hernia was present. DH located at 42                            cm from the incisors.                           The gastroesophageal flap valve was visualized                            endoscopically and classified as Hill Grade IV (no                            fold, wide open lumen, hiatal hernia present).                           The entire examined stomach was normal. Biopsies                            were taken with a cold forceps for Helicobacter                            pylori testing. Estimated blood loss was minimal.                           The examined duodenum was normal.  Biopsies were                            taken with a cold forceps for histology. Estimated                            blood loss was minimal. Complications:            No immediate complications. Estimated Blood Loss:     Estimated blood loss was minimal. Impression:               - Esophageal mucosal changes classified as                            Barrett's stage C4-M4 per Prague criteria.  Biopsied.                           - 2 cm hiatal hernia.                           - Gastroesophageal flap valve classified as Hill                            Grade IV (no fold, wide open lumen, hiatal hernia                            present).                           - Normal stomach. Biopsied.                           - Normal examined duodenum. Biopsied. Recommendation:           - Patient has a contact number available for                            emergencies. The signs and symptoms of potential                            delayed complications were discussed with the                            patient. Return to normal activities tomorrow.                            Written discharge instructions were provided to the                            patient.                           - Resume previous diet.                           - Continue present medications.                           - Await pathology results.                           - Repeat upper endoscopy for surveillance based on                            pathology results.                           - Perform a colonoscopy today. Doristine Locks, MD 12/29/2021 3:50:57 PM

## 2021-12-29 NOTE — Progress Notes (Signed)
GASTROENTEROLOGY PROCEDURE H&P NOTE   Primary Care Physician: Eden Emms, NP    Reason for Procedure:   Abdominal pain, GERD, diarrhea, change in bowel habits, dysphagia, NDBE  Plan:    EGD, colonoscopy   Patient is appropriate for endoscopic procedure(s) in the ambulatory ([facility]) setting.  The nature of the procedure, as well as the risks, benefits, and alternatives were carefully and thoroughly reviewed with the patient. Ample time for discussion and questions allowed. The patient understood, was satisfied, and agreed to proceed.     HPI: Collin Mitchell is a 51 y.o. male who presents for EGD and colonoscopy for evaluation of multiple GI issues to include GERD with NDBE, RUQ pain, Dysphagia, diarrhea, change in stools.   Past Medical History:  Diagnosis Date   Hypercholesterolemia    Hypertension    SVT (supraventricular tachycardia)    s/p ablation    Past Surgical History:  Procedure Laterality Date   CARDIAC ELECTROPHYSIOLOGY MAPPING AND ABLATION     EYE SURGERY     was cross eyed and this was done when patient was aroun 51 years of age    Prior to Admission medications   Medication Sig Start Date End Date Taking? Authorizing Provider  amLODipine (NORVASC) 5 MG tablet Take 1 tablet (5 mg total) by mouth daily. 09/15/21  Yes Eden Emms, NP  atorvastatin (LIPITOR) 40 MG tablet Take 1 tablet (40 mg total) by mouth daily. 09/17/21  Yes Eden Emms, NP  metoprolol succinate (TOPROL-XL) 25 MG 24 hr tablet Take 2 tablets (50 mg total) by mouth 2 (two) times daily. 09/15/21  Yes Eden Emms, NP  cyclobenzaprine (FLEXERIL) 5 MG tablet Take 1 tablet (5 mg total) by mouth 2 (two) times daily as needed for muscle spasms (headaches). 09/15/21   Eden Emms, NP  ferrous sulfate 325 (65 FE) MG tablet Take 325 mg by mouth daily with breakfast.    [provider]  omeprazole (PRILOSEC) 20 MG capsule Take 20 mg by mouth daily.    [provider]     Current Outpatient Medications  Medication Sig Dispense Refill   amLODipine (NORVASC) 5 MG tablet Take 1 tablet (5 mg total) by mouth daily. 90 tablet 1   atorvastatin (LIPITOR) 40 MG tablet Take 1 tablet (40 mg total) by mouth daily. 90 tablet 3   metoprolol succinate (TOPROL-XL) 25 MG 24 hr tablet Take 2 tablets (50 mg total) by mouth 2 (two) times daily. 120 tablet 5   cyclobenzaprine (FLEXERIL) 5 MG tablet Take 1 tablet (5 mg total) by mouth 2 (two) times daily as needed for muscle spasms (headaches). 30 tablet 0   ferrous sulfate 325 (65 FE) MG tablet Take 325 mg by mouth daily with breakfast.     omeprazole (PRILOSEC) 20 MG capsule Take 20 mg by mouth daily.     Current Facility-Administered Medications  Medication Dose Route Frequency Provider Last Rate Last Admin   0.9 %  sodium chloride infusion  500 mL Intravenous Once Erilyn Pearman V, DO        Allergies as of 12/29/2021 - Review Complete 12/29/2021  Allergen Reaction Noted   Penicillins Hives 03/22/2015    Family History  Problem Relation Age of Onset   Hypertension Mother    Valvular heart disease Mother    CAD Mother 52   COPD Father    Supraventricular tachycardia Sister        s/p ablation   Stroke Sister  mini strokes   CAD Maternal Grandmother    Colon cancer Neg Hx    Esophageal cancer Neg Hx    Stomach cancer Neg Hx    Rectal cancer Neg Hx     Social History   Socioeconomic History   Marital status: Married    Spouse name: Not on file   Number of children: 4   Years of education: Not on file   Highest education level: High school graduate  Occupational History   Occupation: manual labor  Tobacco Use   Smoking status: Never    Passive exposure: Past   Smokeless tobacco: Never  Vaping Use   Vaping Use: Never used  Substance and Sexual Activity   Alcohol use: No   Drug use: No   Sexual activity: Not on file  Other Topics Concern   Not on file  Social History Narrative    Fulltime: Administrator, 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   Social Determinants of Health   Financial Resource Strain: Not on file  Food Insecurity: Not on file  Transportation Needs: Not on file  Physical Activity: Not on file  Stress: Not on file  Social Connections: Not on file  Intimate Partner Violence: Not on file    Physical Exam: Vital signs in last 24 hours: @BP  134/79   Pulse 78   Temp 98.9 F (37.2 C)   Ht 5\' 9"  (1.753 m)   Wt 186 lb (84.4 kg)   SpO2 98%   BMI 27.47 kg/m  GEN: NAD EYE: Sclerae anicteric ENT: MMM CV: Non-tachycardic Pulm: CTA b/l GI: Soft, NT/ND NEURO:  Alert & Oriented x 3   , DO [facility]   12/29/2021 3:06 PM

## 2021-12-29 NOTE — Progress Notes (Signed)
Report given to PACU, vss 

## 2021-12-30 ENCOUNTER — Telehealth: Payer: Self-pay

## 2021-12-30 NOTE — Addendum Note (Signed)
Addended by: Mathis Dad on: 12/30/2021 04:18 PM   Modules accepted: Orders

## 2021-12-30 NOTE — Telephone Encounter (Signed)
  Follow up Call-     12/29/2021    2:51 PM  Call back number  Post procedure Call Back phone  # [phone]  Permission to leave phone message Yes     Patient questions:  Do you have a fever, pain , or abdominal swelling? No. Pain Score  0 *  Have you tolerated food without any problems? Yes.    Have you been able to return to your normal activities? Yes.    Do you have any questions about your discharge instructions: Diet   No. Medications  No. Follow up visit  No.  Do you have questions or concerns about your Care? No.  Actions: * If pain score is 4 or above: No action needed, pain <4.

## 2021-12-31 ENCOUNTER — Encounter (HOSPITAL_COMMUNITY): Admission: RE | Admit: 2021-12-31 | Payer: 59 | Source: Ambulatory Visit

## 2021-12-31 NOTE — Addendum Note (Signed)
Addended by: Corbin Ade L on: 12/31/2021 04:16 PM   Modules accepted: Orders

## 2021-12-31 NOTE — Progress Notes (Signed)
Pathology requisition missing information and incorrect information. Corrected requisition and took to lab.

## 2022-01-12 ENCOUNTER — Ambulatory Visit: Payer: 59 | Admitting: Neurology

## 2022-03-18 ENCOUNTER — Encounter: Payer: 59 | Admitting: Nurse Practitioner

## 2022-03-18 DIAGNOSIS — I499 Cardiac arrhythmia, unspecified: Secondary | ICD-10-CM | POA: Insufficient documentation

## 2022-03-19 ENCOUNTER — Ambulatory Visit (INDEPENDENT_AMBULATORY_CARE_PROVIDER_SITE_OTHER)
Admission: RE | Admit: 2022-03-19 | Discharge: 2022-03-19 | Disposition: A | Discharge status: Home / Self Care | Payer: 59 | Source: Ambulatory Visit | Attending: Nurse Practitioner | Admitting: Nurse Practitioner

## 2022-03-19 ENCOUNTER — Encounter: Payer: Self-pay | Admitting: Nurse Practitioner

## 2022-03-19 ENCOUNTER — Ambulatory Visit: Payer: 59 | Admitting: Nurse Practitioner

## 2022-03-19 VITALS — BP 118/78 | HR 76 | Ht 69.0 in | Wt 185.0 lb

## 2022-03-19 DIAGNOSIS — R252 Cramp and spasm: Secondary | ICD-10-CM

## 2022-03-19 DIAGNOSIS — R509 Fever, unspecified: Secondary | ICD-10-CM | POA: Insufficient documentation

## 2022-03-19 DIAGNOSIS — R0609 Other forms of dyspnea: Secondary | ICD-10-CM | POA: Diagnosis not present

## 2022-03-19 DIAGNOSIS — R051 Acute cough: Secondary | ICD-10-CM | POA: Insufficient documentation

## 2022-03-19 DIAGNOSIS — R053 Chronic cough: Secondary | ICD-10-CM | POA: Insufficient documentation

## 2022-03-19 MED ORDER — AZITHROMYCIN 250 MG PO TABS: ORAL_TABLET | ORAL | 0 refills | Status: AC

## 2022-03-19 MED ORDER — ALBUTEROL SULFATE HFA 108 (90 BASE) MCG/ACT IN AERS: 2.0000 | INHALATION_SPRAY | Freq: Four times a day (QID) | RESPIRATORY_TRACT | 0 refills | Status: DC | PRN

## 2022-03-19 MED ORDER — FLUTICASONE PROPIONATE 50 MCG/ACT NA SUSP: 2.0000 | Freq: Every day | NASAL | 0 refills | Status: AC

## 2022-03-19 NOTE — Patient Instructions (Addendum)
Nice to see you today Drink more water that will help with your cramps  I have sentin 3 medications today: an antibiotic, inhaler and nasal spray. Follow up if you do not improve

## 2022-03-19 NOTE — Assessment & Plan Note (Signed)
Has been using albuterol inhaler twice a day will provide a refill.  Pending chest x-ray

## 2022-03-19 NOTE — Assessment & Plan Note (Signed)
Z-Pak, Flonase, albuterol inhaler.  Pending chest x-ray

## 2022-03-19 NOTE — Assessment & Plan Note (Signed)
Still having muscle cramps.  Patient drinks a lot of soda and not much water.  Encourage patient increase hydration if ineffective he can add on magnesium

## 2022-03-19 NOTE — Progress Notes (Signed)
Acute Office Visit  Subjective:     Patient ID: Collin Mitchell, male    DOB: 1971-02-13, 52 y.o.   MRN: [id number]  Chief Complaint  Patient presents with   Cough    X3-75mos, reports shob/possible wheeze, has taken several OTC meds and nothing helps, denies fever/chills     Patient is in today for cough  Started out with a sinus infection. States that he did over the counter stuff like alka seltzer and mucinex. States that it did help. States that he has tried benadryl that has not helped  No sick contacts. Some coworker have been sick  No covid or flu vaccine   States that it has been the same States that he has tried Best boy without relief  Albuterol twice a day  to help with DOE   Review of Systems  Constitutional:  Positive for fever (subjective) and malaise/fatigue. Negative for chills.  HENT:  Negative for ear discharge, ear pain, sinus pain and sore throat.   Respiratory:  Positive for cough, sputum production and shortness of breath (DOE).   Musculoskeletal:  Negative for joint pain and myalgias.  Neurological:  Positive for headaches.        Objective:    BP 118/78   Pulse 76   Ht 5\' 9"  (1.753 m)   Wt 185 lb (83.9 kg)   SpO2 97%   BMI 27.32 kg/m    Physical Exam Vitals and nursing note reviewed.  Constitutional:      Appearance: Normal appearance.  HENT:     Right Ear: Tympanic membrane, ear canal and external ear normal.     Left Ear: Tympanic membrane, ear canal and external ear normal.     Nose:     Right Sinus: No maxillary sinus tenderness or frontal sinus tenderness.     Left Sinus: No maxillary sinus tenderness or frontal sinus tenderness.     Mouth/Throat:     Mouth: Mucous membranes are moist.     Pharynx: Oropharynx is clear.  Cardiovascular:     Rate and Rhythm: Normal rate and regular rhythm.     Heart sounds: Normal heart sounds.  Pulmonary:     Effort: Pulmonary effort is normal.     Breath sounds: Normal breath  sounds.  Neurological:     Mental Status: He is alert.     No results found for any visits on 03/19/22.      Assessment & Plan:   Problem List Items Addressed This Visit       Other   Muscle cramping    Still having muscle cramps.  Patient drinks a lot of soda and not much water.  Encourage patient increase hydration if ineffective he can add on magnesium      Acute cough - Primary    Z-Pak, Flonase, albuterol inhaler.  Pending chest x-ray      Relevant Medications   azithromycin (ZITHROMAX) 250 MG tablet   albuterol (VENTOLIN HFA) 108 (90 Base) MCG/ACT inhaler   fluticasone (FLONASE) 50 MCG/ACT nasal spray   Other Relevant Orders   DG Chest 2 View   DOE (dyspnea on exertion)    Has been using albuterol inhaler twice a day will provide a refill.  Pending chest x-ray      Relevant Medications   albuterol (VENTOLIN HFA) 108 (90 Base) MCG/ACT inhaler   Other Relevant Orders   DG Chest 2 View   Subjective fever    Elect to treat with azithromycin  pack 250 mg as directed pending chest x-ray      Relevant Medications   azithromycin (ZITHROMAX) 250 MG tablet    Meds ordered this encounter  Medications   azithromycin (ZITHROMAX) 250 MG tablet    Sig: Take 2 tablets on day 1, then 1 tablet daily on days 2 through 5    Dispense:  6 tablet    Refill:  0    Order Specific Question:   Supervising Provider    Answer:   Loura Pardon A [[id number]]   albuterol (VENTOLIN HFA) 108 (90 Base) MCG/ACT inhaler    Sig: Inhale 2 puffs into the lungs every 6 (six) hours as needed for wheezing or shortness of breath.    Dispense:  8 g    Refill:  0    Order Specific Question:   Supervising Provider    Answer:   Glori Bickers MARNE A [[id number]]   fluticasone (FLONASE) 50 MCG/ACT nasal spray    Sig: Place 2 sprays into both nostrils daily.    Dispense:  16 g    Refill:  0    Order Specific Question:   Supervising Provider    Answer:   Loura Pardon A [[id number]]    Return as scheduled, for CPE and  Labs.  Romilda Garret, NP

## 2022-03-19 NOTE — Assessment & Plan Note (Addendum)
Elect to treat with azithromycin pack 250 mg as directed pending chest x-ray

## 2022-04-06 ENCOUNTER — Telehealth: Payer: Self-pay | Admitting: Nurse Practitioner

## 2022-04-06 NOTE — Telephone Encounter (Signed)
The cough can last up to 6 weeks but the important thing is if it is improving? Is it improving

## 2022-04-06 NOTE — Telephone Encounter (Signed)
-----   Message from Barkley Bruns, [place] sent at 04/06/2022 12:44 PM EST ----- Called patient reviewed all information and repeated back to me. Will call if any questions.  Pt stated that he finished the medication but still having a cough.

## 2022-04-06 NOTE — Telephone Encounter (Signed)
Left message to return call to our office.  

## 2022-04-10 NOTE — Telephone Encounter (Signed)
Patient states the cough has slowed down a lot

## 2022-04-10 NOTE — Telephone Encounter (Signed)
Left message to return call to our office.  

## 2022-05-13 ENCOUNTER — Encounter: Payer: Self-pay | Admitting: Nurse Practitioner

## 2022-05-13 ENCOUNTER — Ambulatory Visit (INDEPENDENT_AMBULATORY_CARE_PROVIDER_SITE_OTHER): Payer: 59 | Admitting: Nurse Practitioner

## 2022-05-13 VITALS — BP 116/68 | HR 64 | Temp 98.2°F | Resp 16 | Ht 69.5 in | Wt 181.2 lb

## 2022-05-13 DIAGNOSIS — R051 Acute cough: Secondary | ICD-10-CM | POA: Diagnosis not present

## 2022-05-13 DIAGNOSIS — R0609 Other forms of dyspnea: Secondary | ICD-10-CM | POA: Diagnosis not present

## 2022-05-13 DIAGNOSIS — E663 Overweight: Secondary | ICD-10-CM

## 2022-05-13 DIAGNOSIS — I10 Essential (primary) hypertension: Secondary | ICD-10-CM | POA: Diagnosis not present

## 2022-05-13 DIAGNOSIS — E7800 Pure hypercholesterolemia, unspecified: Secondary | ICD-10-CM | POA: Diagnosis not present

## 2022-05-13 DIAGNOSIS — Z0000 Encounter for general adult medical examination without abnormal findings: Secondary | ICD-10-CM

## 2022-05-13 DIAGNOSIS — Z125 Encounter for screening for malignant neoplasm of prostate: Secondary | ICD-10-CM

## 2022-05-13 DIAGNOSIS — K2270 Barrett's esophagus without dysplasia: Secondary | ICD-10-CM

## 2022-05-13 MED ORDER — LEVOCETIRIZINE DIHYDROCHLORIDE 5 MG PO TABS: 5.0000 mg | ORAL_TABLET | Freq: Every evening | ORAL | 1 refills | Status: AC

## 2022-05-13 MED ORDER — AMLODIPINE BESYLATE 5 MG PO TABS: 5.0000 mg | ORAL_TABLET | Freq: Every day | ORAL | 1 refills | Status: DC

## 2022-05-13 MED ORDER — METOPROLOL SUCCINATE ER 25 MG PO TB24: 50.0000 mg | ORAL_TABLET | Freq: Two times a day (BID) | ORAL | 5 refills | Status: DC

## 2022-05-13 MED ORDER — OMEPRAZOLE 40 MG PO CPDR: 40.0000 mg | DELAYED_RELEASE_CAPSULE | Freq: Every day | ORAL | 0 refills | Status: AC

## 2022-05-13 MED ORDER — ALBUTEROL SULFATE HFA 108 (90 BASE) MCG/ACT IN AERS: 2.0000 | INHALATION_SPRAY | Freq: Four times a day (QID) | RESPIRATORY_TRACT | 0 refills | Status: DC | PRN

## 2022-05-13 MED ORDER — ONDANSETRON 4 MG PO TBDP: 4.0000 mg | ORAL_TABLET | Freq: Three times a day (TID) | ORAL | 0 refills | Status: DC | PRN

## 2022-05-13 NOTE — Patient Instructions (Signed)
Nice to see you today I will be in touch with the labs once I have them Make a FASTING lab appointment within the next two weeks Follow up with me in 1 month, sooner if you need me

## 2022-05-13 NOTE — Assessment & Plan Note (Signed)
Patient still having some DOE using inhaler not quite as much.  Has a cough.  Given that he has had a cough for 3 to 4 months prior to my evaluation will like to do CT scan of chest order placed today refill of albuterol inhaler.  Will put patient on antihistamine and a higher dose PPI

## 2022-05-13 NOTE — Assessment & Plan Note (Signed)
Discussed age-appropriate immunizations and screening exams.  Did review patient's personal, surgical, family, social histories.  Patient is up-to-date on tetanus vaccine and COVID-vaccine.  Refused flu vaccine.  Patient is up-to-date on CRC screening.  Will order PSA for prostate cancer screening.  Patient was given information at discharge about preventative healthcare maintenance with anticipatory guidance.

## 2022-05-13 NOTE — Assessment & Plan Note (Signed)
Patient currently maintained on atorvastatin 40 mg daily.  Pending lipid panel.

## 2022-05-13 NOTE — Assessment & Plan Note (Signed)
Placed patient on antihistamine increase PPI dosing continue Flonase patient continue using the albuterol inhaler as needed.  Pending CT scan of chest

## 2022-05-13 NOTE — Assessment & Plan Note (Signed)
Patient currently maintained on amlodipine 5 mg daily, metoprolol 2 tablets twice a day and 25 mg XR.  Patient's blood pressure within normal limits today.  Continue medication as prescribed

## 2022-05-13 NOTE — Assessment & Plan Note (Signed)
Followed by GI.  Patient esophagus without dysplasia.  Currently on omeprazole 20 mg.  Patient still having a cough we will increase omeprazole 40 mg daily.

## 2022-05-13 NOTE — Progress Notes (Signed)
Established Patient Office Visit  Subjective   Patient ID: Collin Mitchell, male    DOB: July 17, 1970  Age: 52 y.o. MRN: [id number]  Chief Complaint  Patient presents with   Annual Exam    HPI  for complete physical and follow up of chronic conditions.  HTN: Patient currently maintained on amlodipine 5 mg metoprolol 25 mg 2 tablets twice daily. States that he checks it when he is out  [employer] esophagus: Patient had endoscopy on 12/2021.  This showed Barrett's esophagus.  Patient does not recall 3 years currently on omeprazole 20 mg  Cough: states that he is using the inhaler 3 times a week. Shortness of breath with movement. Cough. States that it has been slowing down and then comes back and night and will vomit  Immunizations: -Tetanus: Completed in 2018 -Influenza: refused -Shingles:  -Pneumonia: Completed  -covid: 2 shot series.   Diet: Fair diet. 2 meals a day and lots of snacks. States that he drinks powerade zero and do water Exercise: No regular exercise.  Eye exam: Completes annually. At least 2 years glasses only  Dental exam: needs    Colonoscopy: Completed in 12/29/2021, repeat in 2024  Endoscopy: 12/2021 with 3 year repeat Lung Cancer Screening: Completed in   PSA: Due  Sleep: states that he goes to bed around 9 and has difficulty. States the cough worse with laying flat. Gets up 530. Does not feel rested. States that he does snore       Review of Systems  Constitutional:  Negative for chills and fever.  Respiratory:  Positive for cough and shortness of breath.   Cardiovascular:  Negative for chest pain and leg swelling.  Gastrointestinal:  Negative for abdominal pain, blood in stool, constipation, diarrhea, nausea and vomiting.       BM daily   Genitourinary:  Negative for dysuria and hematuria.  Neurological:  Negative for tingling and headaches.  Psychiatric/Behavioral:  Negative for hallucinations and suicidal ideas.       Objective:     BP  116/68   Pulse 64   Temp 98.2 F (36.8 C)   Resp 16   Ht 5' 9.5" (1.765 m)   Wt 181 lb 4 oz (82.2 kg)   SpO2 97%   BMI 26.38 kg/m  BP Readings from Last 3 Encounters:  05/13/22 116/68  03/19/22 118/78  12/29/21 108/86   Wt Readings from Last 3 Encounters:  05/13/22 181 lb 4 oz (82.2 kg)  03/19/22 185 lb (83.9 kg)  12/29/21 186 lb (84.4 kg)      Physical Exam Vitals and nursing note reviewed.  Constitutional:      Appearance: Normal appearance.  HENT:     Right Ear: Tympanic membrane, ear canal and external ear normal.     Left Ear: Tympanic membrane, ear canal and external ear normal.     Mouth/Throat:     Mouth: Mucous membranes are moist.     Pharynx: Oropharynx is clear.  Eyes:     Extraocular Movements: Extraocular movements intact.     Pupils: Pupils are equal, round, and reactive to light.  Cardiovascular:     Rate and Rhythm: Normal rate and regular rhythm.     Pulses: Normal pulses.     Heart sounds: Normal heart sounds.  Pulmonary:     Effort: Pulmonary effort is normal.     Breath sounds: Normal breath sounds.  Abdominal:     General: Bowel sounds are normal. There is no distension.  Palpations: There is no mass.     Tenderness: There is no abdominal tenderness.     Hernia: No hernia is present.     Comments: Rectus diastasis   Genitourinary:    Comments: Deferred per patient  Musculoskeletal:     Right lower leg: No edema.     Left lower leg: No edema.  Lymphadenopathy:     Cervical: No cervical adenopathy.  Skin:    General: Skin is warm.  Neurological:     General: No focal deficit present.     Mental Status: He is alert.     Deep Tendon Reflexes:     Reflex Scores:      Bicep reflexes are 2+ on the right side and 2+ on the left side.      Patellar reflexes are 2+ on the right side and 2+ on the left side.    Comments: Bilateral upper and lower extremity strength 5/5  Psychiatric:        Mood and Affect: Mood normal.        Behavior:  Behavior normal.        Thought Content: Thought content normal.        Judgment: Judgment normal.      No results found for any visits on 05/13/22.    The 10-year ASCVD risk score (Arnett DK, et al., 2019) is: 4.9%    Assessment & Plan:   Problem List Items Addressed This Visit       Cardiovascular and Mediastinum   Hypertension    Patient currently maintained on amlodipine 5 mg daily, metoprolol 2 tablets twice a day and 25 mg XR.  Patient's blood pressure within normal limits today.  Continue medication as prescribed      Relevant Medications   metoprolol succinate (TOPROL-XL) 25 MG 24 hr tablet   amLODipine (NORVASC) 5 MG tablet   Other Relevant Orders   CBC   Comprehensive metabolic panel   Hemoglobin A1c     Digestive   Barrett's esophagus without dysplasia    Followed by GI.  Patient esophagus without dysplasia.  Currently on omeprazole 20 mg.  Patient still having a cough we will increase omeprazole 40 mg daily.      Relevant Medications   omeprazole (PRILOSEC) 40 MG capsule     Other   Hypercholesterolemia - Primary    Patient currently maintained on atorvastatin 40 mg daily.  Pending lipid panel.      Relevant Medications   metoprolol succinate (TOPROL-XL) 25 MG 24 hr tablet   amLODipine (NORVASC) 5 MG tablet   Other Relevant Orders   Lipid panel   Hemoglobin A1c   Acute cough    Placed patient on antihistamine increase PPI dosing continue Flonase patient continue using the albuterol inhaler as needed.  Pending CT scan of chest      Relevant Medications   albuterol (VENTOLIN HFA) 108 (90 Base) MCG/ACT inhaler   omeprazole (PRILOSEC) 40 MG capsule   levocetirizine (XYZAL) 5 MG tablet   Other Relevant Orders   CT Chest Wo Contrast   DOE (dyspnea on exertion)    Patient still having some DOE using inhaler not quite as much.  Has a cough.  Given that he has had a cough for 3 to 4 months prior to my evaluation will like to do CT scan of chest order  placed today refill of albuterol inhaler.  Will put patient on antihistamine and a higher dose PPI  Relevant Medications   albuterol (VENTOLIN HFA) 108 (90 Base) MCG/ACT inhaler   Other Relevant Orders   CT Chest Wo Contrast   Preventative health care    Discussed age-appropriate immunizations and screening exams.  Did review patient's personal, surgical, family, social histories.  Patient is up-to-date on tetanus vaccine and COVID-vaccine.  Refused flu vaccine.  Patient is up-to-date on CRC screening.  Will order PSA for prostate cancer screening.  Patient was given information at discharge about preventative healthcare maintenance with anticipatory guidance.      Relevant Orders   CBC   Comprehensive metabolic panel   Other Visit Diagnoses     Overweight       Relevant Orders   Hemoglobin A1c   Screening for prostate cancer       Relevant Orders   PSA       Return in about 4 weeks (around 06/10/2022) for Cough and shortness of breath .    Romilda Garret, NP

## 2022-05-14 ENCOUNTER — Other Ambulatory Visit (INDEPENDENT_AMBULATORY_CARE_PROVIDER_SITE_OTHER): Payer: 59

## 2022-05-14 DIAGNOSIS — E663 Overweight: Secondary | ICD-10-CM

## 2022-05-14 DIAGNOSIS — Z125 Encounter for screening for malignant neoplasm of prostate: Secondary | ICD-10-CM | POA: Diagnosis not present

## 2022-05-14 DIAGNOSIS — E7800 Pure hypercholesterolemia, unspecified: Secondary | ICD-10-CM | POA: Diagnosis not present

## 2022-05-14 DIAGNOSIS — I10 Essential (primary) hypertension: Secondary | ICD-10-CM | POA: Diagnosis not present

## 2022-05-14 DIAGNOSIS — Z0000 Encounter for general adult medical examination without abnormal findings: Secondary | ICD-10-CM

## 2022-05-14 LAB — COMPREHENSIVE METABOLIC PANEL
ALT: 14 U/L (ref 0–53)
AST: 17 U/L (ref 0–37)
Albumin: 4.2 g/dL (ref 3.5–5.2)
Alkaline Phosphatase: 122 U/L — ABNORMAL HIGH (ref 39–117)
BUN: 14 mg/dL (ref 6–23)
CO2: 29 mEq/L (ref 19–32)
Calcium: 8.7 mg/dL (ref 8.4–10.5)
Chloride: 104 mEq/L (ref 96–112)
Creatinine, Ser: 1.06 mg/dL (ref 0.40–1.50)
GFR: 80.74 mL/min (ref >60.00)
Glucose, Bld: 94 mg/dL (ref 70–99)
Potassium: 4.1 mEq/L (ref 3.5–5.1)
Sodium: 140 mEq/L (ref 135–145)
Total Bilirubin: 0.8 mg/dL (ref 0.2–1.2)
Total Protein: 6.6 g/dL (ref 6.0–8.3)

## 2022-05-14 LAB — CBC
HCT: 44.7 % (ref 39.0–52.0)
Hemoglobin: 15.1 g/dL (ref 13.0–17.0)
MCHC: 33.7 g/dL (ref 30.0–36.0)
MCV: 89.9 fl (ref 78.0–100.0)
Platelets: 268 10*3/uL (ref 150.0–400.0)
RBC: 4.97 Mil/uL (ref 4.22–5.81)
RDW: 13.6 % (ref 11.5–15.5)
WBC: 7.8 10*3/uL (ref 4.0–10.5)

## 2022-05-14 LAB — LIPID PANEL
Cholesterol: 195 mg/dL (ref 0–200)
HDL: 42.1 mg/dL (ref >39.00)
LDL Cholesterol: 139 mg/dL — ABNORMAL HIGH (ref 0–99)
NonHDL: 153.21
Total CHOL/HDL Ratio: 5
Triglycerides: 71 mg/dL (ref 0.0–149.0)
VLDL: 14.2 mg/dL (ref 0.0–40.0)

## 2022-05-14 LAB — HEMOGLOBIN A1C: Hgb A1c MFr Bld: 5.6 % (ref 4.6–6.5)

## 2022-05-14 LAB — PSA: PSA: 1.42 ng/mL (ref 0.10–4.00)

## 2022-06-15 ENCOUNTER — Encounter: Payer: Self-pay | Admitting: Nurse Practitioner

## 2022-06-15 ENCOUNTER — Ambulatory Visit: Payer: 59 | Admitting: Nurse Practitioner

## 2022-06-15 VITALS — BP 130/72 | HR 70 | Temp 98.1°F | Resp 16 | Ht 69.5 in | Wt 184.1 lb

## 2022-06-15 DIAGNOSIS — R053 Chronic cough: Secondary | ICD-10-CM | POA: Diagnosis not present

## 2022-06-15 DIAGNOSIS — S30860A Insect bite (nonvenomous) of lower back and pelvis, initial encounter: Secondary | ICD-10-CM

## 2022-06-15 DIAGNOSIS — W57XXXA Bitten or stung by nonvenomous insect and other nonvenomous arthropods, initial encounter: Secondary | ICD-10-CM | POA: Diagnosis not present

## 2022-06-15 NOTE — Patient Instructions (Addendum)
Nice to see you today The spot on your leg looks good. You can use a bendaryl cream or hydrocortisone cream as needed Keep an eye on it if the redness spreads or you start having fever, chills, numbness, tingling, abdominal pain, nausea/vomiting let me know  Follow up 1 year for your next physical and labs  I will be in touch with the CT scan results once I have them and have reviewed them

## 2022-06-15 NOTE — Assessment & Plan Note (Signed)
No signs or symptoms in the candidate for antibiotic treatment.  Did give patient signs symptoms to look out for and reach out if happens if they do happen will treat with doxycycline 100 mg twice daily for 7 days

## 2022-06-15 NOTE — Progress Notes (Signed)
   Established Patient Office Visit  Subjective   Patient ID: Collin Mitchell, male    DOB: 1970-10-26  Age: 52 y.o. MRN: [id number]  Chief Complaint  Patient presents with   Shortness of Breath   Cough    Follow up    Tick Removal    Removed a tick Saturday and place is red.     Cough/SHOB: we did increase the patients omeprazole to 40mg  last office visit. Also plaed him on a antihistamine and flonase along with ordering the CT scan of the chest. Scan is scheduled for 06/18/2022. No change I nthe cough at all. States that the shortness of breath has improved some.    Tick bite: states that he noticed it satiurday helping his mom in the yard. States  that he was able to pull it off and it was detached. Spot is itching and red. States that there is some pus.    Review of Systems  Respiratory:  Positive for cough and shortness of breath.   Gastrointestinal:  Negative for abdominal pain, constipation, diarrhea, nausea and vomiting.  Skin:  Positive for itching and rash.      Objective:     BP 130/72   Pulse 70   Temp 98.1 F (36.7 C)   Resp 16   Ht 5' 9.5" (1.765 m)   Wt 184 lb 2 oz (83.5 kg)   SpO2 98%   BMI 26.80 kg/m    Physical Exam Vitals and nursing note reviewed.  Constitutional:      Appearance: Normal appearance.  Cardiovascular:     Rate and Rhythm: Normal rate and regular rhythm.     Heart sounds: Normal heart sounds.  Pulmonary:     Effort: Pulmonary effort is normal.     Breath sounds: Normal breath sounds.  Skin:      Neurological:     Mental Status: He is alert.      No results found for any visits on 06/15/22.    The 10-year ASCVD risk score (Arnett DK, et al., 2019) is: 5.8%    Assessment & Plan:   Problem List Items Addressed This Visit       Musculoskeletal and Integument   Tick bite of pelvic region    No signs or symptoms in the candidate for antibiotic treatment.  Did give patient signs symptoms to look out for and reach  out if happens if they do happen will treat with doxycycline 100 mg twice daily for 7 days        Other   Chronic cough - Primary    Patient's PPI was doubled from omeprazole 20 mg to 40 mg.  He was placed on antihistamine and fluticasone nasal spray without resolution.  Patient has a CT scan scheduled for 06/18/2022.  Pending results       Return in about 1 year (around 06/15/2023) for CPE and Labs.    Audria Nine, NP

## 2022-06-15 NOTE — Assessment & Plan Note (Addendum)
Patient's PPI was doubled from omeprazole 20 mg to 40 mg.  He was placed on antihistamine and fluticasone nasal spray without resolution.  Patient has a CT scan scheduled for 06/18/2022.  Pending results

## 2022-06-18 ENCOUNTER — Ambulatory Visit
Admission: RE | Admit: 2022-06-18 | Discharge: 2022-06-18 | Disposition: A | Discharge status: Home / Self Care | Payer: 59 | Source: Ambulatory Visit | Attending: Nurse Practitioner | Admitting: Nurse Practitioner

## 2022-06-18 DIAGNOSIS — R0609 Other forms of dyspnea: Secondary | ICD-10-CM

## 2022-06-18 DIAGNOSIS — R051 Acute cough: Secondary | ICD-10-CM

## 2022-06-22 ENCOUNTER — Ambulatory Visit (HOSPITAL_COMMUNITY)
Admission: EM | Admit: 2022-06-22 | Discharge: 2022-06-22 | Disposition: A | Discharge status: Home / Self Care | Payer: 59 | Attending: Nurse Practitioner | Admitting: Nurse Practitioner

## 2022-06-22 ENCOUNTER — Ambulatory Visit (INDEPENDENT_AMBULATORY_CARE_PROVIDER_SITE_OTHER): Payer: 59

## 2022-06-22 ENCOUNTER — Encounter (HOSPITAL_COMMUNITY): Payer: Self-pay

## 2022-06-22 DIAGNOSIS — M76899 Other specified enthesopathies of unspecified lower limb, excluding foot: Secondary | ICD-10-CM

## 2022-06-22 MED ORDER — DICLOFENAC SODIUM 1 % EX GEL: 4.0000 g | Freq: Four times a day (QID) | CUTANEOUS | 0 refills | Status: AC

## 2022-06-22 MED ORDER — KETOROLAC TROMETHAMINE 30 MG/ML IJ SOLN
30.0000 mg | Freq: Once | INTRAMUSCULAR | Status: AC
  Administered 2022-06-22: 30 mg via INTRAMUSCULAR

## 2022-06-22 MED ORDER — KETOROLAC TROMETHAMINE 30 MG/ML IJ SOLN
INTRAMUSCULAR | Status: AC
  Filled 2022-06-22: qty 1
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
## 2022-06-22 NOTE — Discharge Instructions (Addendum)
The knee x-ray today shows a small bone spur near your quadricep tendon.  This may be causing the pain.  We have given you a shot of Toradol today for pain-do not take any other NSAIDs for the next 48 hours.  This includes ibuprofen, Motrin, Aleve, and naproxen.  You can use the topical Voltaren gel 4 times daily as needed for pain.  Please follow-up with an orthopedic provider if no improvement or worsening symptoms despite treatment.

## 2022-06-22 NOTE — ED Triage Notes (Signed)
Here for R-knee pain x 2 days. Denies any recent falls or trauma.

## 2022-06-23 ENCOUNTER — Telehealth: Payer: Self-pay | Admitting: Nurse Practitioner

## 2022-06-23 DIAGNOSIS — R053 Chronic cough: Secondary | ICD-10-CM

## 2022-06-23 DIAGNOSIS — R911 Solitary pulmonary nodule: Secondary | ICD-10-CM

## 2022-06-23 NOTE — Telephone Encounter (Signed)
Called and read what Collin Mitchell said on the scan. He prefers to see someone in [place].

## 2022-06-23 NOTE — Addendum Note (Signed)
Addended by: Eden Emms on: 06/23/2022 01:25 PM   Modules accepted: Orders

## 2022-06-23 NOTE — Telephone Encounter (Signed)
Referral placed.

## 2022-06-23 NOTE — Telephone Encounter (Signed)
Patient called in to follow up on his imaging he had done. He stated that he hasn't heard anything back yet. Thank you!

## 2022-07-01 ENCOUNTER — Encounter: Payer: Self-pay | Admitting: Emergency Medicine

## 2022-07-01 ENCOUNTER — Ambulatory Visit (INDEPENDENT_AMBULATORY_CARE_PROVIDER_SITE_OTHER): Payer: 59 | Admitting: Emergency Medicine

## 2022-07-01 VITALS — BP 126/78 | HR 61 | Temp 98.0°F | Ht 69.0 in | Wt 183.4 lb

## 2022-07-01 DIAGNOSIS — R053 Chronic cough: Secondary | ICD-10-CM | POA: Diagnosis not present

## 2022-07-01 MED ORDER — PREDNISONE 10 MG PO TABS: 20.0000 mg | ORAL_TABLET | Freq: Every day | ORAL | 0 refills | Status: AC

## 2022-07-01 MED ORDER — PANTOPRAZOLE SODIUM 40 MG PO TBEC: 40.0000 mg | DELAYED_RELEASE_TABLET | Freq: Two times a day (BID) | ORAL | 0 refills | Status: DC

## 2022-07-01 MED ORDER — BENZONATATE 100 MG PO CAPS: 100.0000 mg | ORAL_CAPSULE | Freq: Four times a day (QID) | ORAL | 1 refills | Status: DC | PRN

## 2022-07-01 MED ORDER — HYDROCODONE BIT-HOMATROP MBR 5-1.5 MG/5ML PO SOLN: 5.0000 mL | Freq: Four times a day (QID) | ORAL | 0 refills | Status: DC | PRN

## 2022-07-01 NOTE — Assessment & Plan Note (Signed)
Some of this sounds upper airway in nature but he does have a very subtle abnormality on CT chest consistent with possible bronchiolitis, unclear significance.  I think it would be reasonable to try to ramp up his GERD therapy further since he does have GERD with Barrett's esophagus.  Continue his allergy regimen.  We will perform pulmonary function testing to look for any evidence of occult obstruction given his history of childhood asthma.  We will initiate cough suppression to try and allow his upper airway irritation to calm.  Also give him 5 days of prednisone for anti-inflammatory effect.  With regard to his abnormal CT chest he will at least need a repeat scan and coming months to ensure resolution of the subtle inflammatory change.  If his cough persists then it may be appropriate to form bronchoscopy, obtain BAL for culture data that we may be able to correlate with his CT findings.

## 2022-07-01 NOTE — Patient Instructions (Addendum)
We reviewed your CT scan of the chest today We will perform full pulmonary function testing Lab work today We will temporarily stop omeprazole. Start pantoprazole 40mg  twice a day for 1 month. Take 1 hour around food.  Continue to take Xyzal once daily Continue fluticasone nasal spray, 2 sprays each nostril once a day. Don't take this right before bed time or it will drain to your throat and cause irritation.  Please take prednisone 20 mg daily for 5 days and then stop. Use Tessalon Perles 100 mg up to every 6 hours if needed for cough suppression. Try using Hycodan cough syrup 5 cc before bedtime for cough suppression.  Please be cautioned that this medication can cause drowsiness. Follow with Dr Delton Coombes in 1 month or next available so we can assess your cough and review your testing.

## 2022-07-01 NOTE — Progress Notes (Signed)
Subjective:    Patient ID: Collin Mitchell, male    DOB: [birth year], 52 y.o.   MRN: [id number]  HPI Collin Mitchell is 49, never smoker with history of hypertension, hyperlipidemia, SVT post ablation, GERD with Barrett's, childhood asthma.  He is referred today for chronic cough, abnormal chest imaging. He has been dealing with cough for about 1 year - seemed to start after a URI. He feels a globus sensation, usually non-productive. He has been tried on prednisone / abx about 6 months ago without any relief..  States that it started.  His omeprazole was increased from 20 mg to 40 mg daily and he was also started on an antihistamine and fluticasone nasal spray. Also tried  No real change in his cough burden.  He coughs at night, wakes him from sleep. A CT chest was performed to further evaluate as below Sometimes gets ulcers on his tongue, sometimes gets white plaques on his tongue.  No fevers, wt loss, sweats.   CBC 05/14/22, normal WBC but no diff.   CT chest 06/18/2022 reviewed by me, shows no mediastinal or hilar adenopathy.  There is a small cluster of tree-in-bud micro nodularity in the peripheral right lower lobe as well as a 3 mm posterior left lower lobe perifissural nodule.  No evidence of bronchiectasis.   Review of Systems As per HPI  Past Medical History:  Diagnosis Date   Hypercholesterolemia    Hypertension    SVT (supraventricular tachycardia)    s/p ablation     Family History  Problem Relation Age of Onset   Hypertension Mother    Valvular heart disease Mother    CAD Mother 77   COPD Father    Supraventricular tachycardia Sister        s/p ablation   Stroke Sister        mini strokes   CAD Maternal Grandmother    Colon cancer Neg Hx    Esophageal cancer Neg Hx    Stomach cancer Neg Hx    Rectal cancer Neg Hx      Social History   Socioeconomic History   Marital status: Married    Spouse name: Not on file   Number of children: 4   Years of education: Not on  file   Highest education level: High school graduate  Occupational History   Occupation: manual labor  Tobacco Use   Smoking status: Never    Passive exposure: Past   Smokeless tobacco: Never  Vaping Use   Vaping Use: Never used  Substance and Sexual Activity   Alcohol use: No   Drug use: No   Sexual activity: Not on file  Other Topics Concern   Not on file  Social History Narrative   Fulltime: Administrator, 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 8 g 0   amLODipine (NORVASC) 5 MG tablet Take 1 tablet (5 mg total)  by mouth daily. 90 tablet 1   atorvastatin (LIPITOR) 40 MG tablet Take 1 tablet (40 mg total) by mouth daily. 90 tablet 3   cyclobenzaprine (FLEXERIL) 5 MG tablet Take 1 tablet (5 mg total) by mouth 2 (two) times daily as needed for muscle spasms (headaches). 30 tablet 0   diclofenac Sodium (VOLTAREN ARTHRITIS PAIN) 1 % GEL Apply 4 g topically 4 (four) times daily. 50 g 0   ferrous sulfate 325 (65 FE) MG tablet Take 325 mg by mouth daily with breakfast.     fluticasone (FLONASE) 50 MCG/ACT nasal spray Place 2 sprays into both nostrils daily. 16 g 0   levocetirizine (XYZAL) 5 MG tablet Take 1 tablet (5 mg total) by mouth every evening. 30 tablet 1   metoprolol succinate (TOPROL-XL) 25 MG 24 hr tablet Take 2 tablets (50 mg total) by mouth 2 (two) times daily. 120 tablet 5   omeprazole (PRILOSEC) 40 MG capsule Take 1 capsule (40 mg total)  by mouth daily. 90 capsule 0   ondansetron (ZOFRAN-ODT) 4 MG disintegrating tablet Take 1 tablet (4 mg total) by mouth every 8 (eight) hours as needed for nausea or vomiting. 20 tablet 0   No facility-administered medications prior to visit.        Objective:   Physical Exam  Vitals:   07/01/22 1450  BP: 126/78  Pulse: 61  Temp: 98 F (36.7 C)  TempSrc: Oral  SpO2: 97%  Weight: 183 lb 6.4 oz (83.2 kg)  Height: 5\' 9"  (1.753 m)   Gen: Pleasant, well-nourished, in no distress,  normal affect  ENT: No lesions,  mouth clear,  oropharynx clear, no postnasal drip  Neck: No JVD, no stridor  Lungs: No use of accessory muscles, no crackles or wheezing on normal respiration, no wheeze on forced expiration  Cardiovascular: RRR, heart sounds normal, no murmur or gallops, no peripheral edema  Musculoskeletal: No deformities, no cyanosis or clubbing  Neuro: alert, awake, non focal  Skin: Warm, no lesions or rash     Assessment & Plan:   Chronic cough Some of this sounds upper airway in nature but he does have a very subtle abnormality on CT chest consistent with possible bronchiolitis, unclear significance.  I think it would be reasonable to try to ramp up his GERD therapy further since he does have GERD with Barrett's esophagus.  Continue his allergy regimen.  We will perform pulmonary function testing to look for any evidence of occult obstruction given his history of childhood asthma.  We will initiate cough suppression to try and allow his upper airway irritation to calm.  Also give him 5 days of prednisone for anti-inflammatory effect.  With regard to his abnormal CT chest he will at least need a repeat scan and coming months to ensure resolution of the subtle inflammatory change.  If his cough persists then it may be appropriate to form bronchoscopy, obtain BAL for culture data that we may be able to correlate with his CT findings.   Levy Pupa, MD, PhD 07/01/2022, 3:26  PM [facility] [phone] or if no answer before 7:00PM call [phone] For any issues after 7:00PM please call eLink [phone]

## 2022-07-01 NOTE — Addendum Note (Signed)
Addended by: Dorisann Frames R on: 07/01/2022 03:38 PM   Modules accepted: Orders

## 2022-07-02 LAB — CBC WITH DIFFERENTIAL/PLATELET
Basophils Absolute: 0.1 10*3/uL (ref 0.0–0.1)
Basophils Relative: 0.5 % (ref 0.0–3.0)
Eosinophils Absolute: 0.2 10*3/uL (ref 0.0–0.7)
Eosinophils Relative: 2 % (ref 0.0–5.0)
HCT: 45.2 % (ref 39.0–52.0)
Hemoglobin: 15.3 g/dL (ref 13.0–17.0)
Lymphocytes Relative: 33.3 % (ref 12.0–46.0)
Lymphs Abs: 3.7 10*3/uL (ref 0.7–4.0)
MCHC: 33.8 g/dL (ref 30.0–36.0)
MCV: 90.1 fl (ref 78.0–100.0)
Monocytes Absolute: 1 10*3/uL (ref 0.1–1.0)
Monocytes Relative: 9.1 % (ref 3.0–12.0)
Neutro Abs: 6.1 10*3/uL (ref 1.4–7.7)
Neutrophils Relative %: 55.1 % (ref 43.0–77.0)
Platelets: 278 10*3/uL (ref 150.0–400.0)
RBC: 5.02 Mil/uL (ref 4.22–5.81)
RDW: 13.4 % (ref 11.5–15.5)
WBC: 11.1 10*3/uL — ABNORMAL HIGH (ref 4.0–10.5)

## 2022-08-05 ENCOUNTER — Encounter (HOSPITAL_BASED_OUTPATIENT_CLINIC_OR_DEPARTMENT_OTHER): Payer: 59

## 2022-08-12 ENCOUNTER — Encounter (HOSPITAL_COMMUNITY): Payer: Self-pay

## 2022-08-12 ENCOUNTER — Emergency Department (HOSPITAL_COMMUNITY)
Admission: EM | Admit: 2022-08-12 | Discharge: 2022-08-12 | Disposition: A | Discharge status: Home / Self Care | Payer: 59 | Attending: Emergency Medicine | Admitting: Emergency Medicine

## 2022-08-12 ENCOUNTER — Emergency Department (HOSPITAL_COMMUNITY): Payer: 59

## 2022-08-12 ENCOUNTER — Other Ambulatory Visit: Payer: Self-pay

## 2022-08-12 DIAGNOSIS — I10 Essential (primary) hypertension: Secondary | ICD-10-CM | POA: Diagnosis not present

## 2022-08-12 DIAGNOSIS — R079 Chest pain, unspecified: Secondary | ICD-10-CM | POA: Diagnosis present

## 2022-08-12 DIAGNOSIS — Z79899 Other long term (current) drug therapy: Secondary | ICD-10-CM | POA: Diagnosis not present

## 2022-08-12 LAB — BASIC METABOLIC PANEL
Anion gap: 12 (ref 5–15)
BUN: 13 mg/dL (ref 6–20)
CO2: 25 mmol/L (ref 22–32)
Calcium: 8.4 mg/dL — ABNORMAL LOW (ref 8.9–10.3)
Chloride: 102 mmol/L (ref 98–111)
Creatinine, Ser: 1.1 mg/dL (ref 0.61–1.24)
GFR, Estimated: >60 mL/min (ref >60)
Glucose, Bld: 124 mg/dL — ABNORMAL HIGH (ref 70–99)
Potassium: 3.3 mmol/L — ABNORMAL LOW (ref 3.5–5.1)
Sodium: 139 mmol/L (ref 135–145)

## 2022-08-12 LAB — TROPONIN I (HIGH SENSITIVITY)
Troponin I (High Sensitivity): 5 ng/L (ref <18)
Troponin I (High Sensitivity): 5 ng/L (ref <18)

## 2022-08-12 LAB — CBC
HCT: 43.3 % (ref 39.0–52.0)
Hemoglobin: 14.8 g/dL (ref 13.0–17.0)
MCH: 31 pg (ref 26.0–34.0)
MCHC: 34.2 g/dL (ref 30.0–36.0)
MCV: 90.8 fL (ref 80.0–100.0)
Platelets: 256 10*3/uL (ref 150–400)
RBC: 4.77 MIL/uL (ref 4.22–5.81)
RDW: 12.1 % (ref 11.5–15.5)
WBC: 9 10*3/uL (ref 4.0–10.5)
nRBC: 0 % (ref 0.0–0.2)

## 2022-08-12 NOTE — ED Provider Notes (Signed)
[place] EMERGENCY DEPARTMENT AT [facility] Provider Note   CSN: [id number] Arrival date & time: 08/12/22  4098     History  Chief Complaint  Patient presents with   Chest Pain    Collin Mitchell is a 52 y.o. male.   Chest Pain Patient presents with anterior chest pain.  States he was walking around and felt anterior chest pain going to his left arm.  Had reported MI 5 years ago.  States he felt his heart beating hard and fast.  Improved after nitroglycerin.  Patient told me pain has resolved but told nurse still 5 out of 10.  Reviewing notes appears of had a history of SVT.    Past Medical History:  Diagnosis Date   Hypercholesterolemia    Hypertension    SVT (supraventricular tachycardia)    s/p ablation    Home Medications Prior to Admission medications   Medication Sig Start Date End Date Taking? Authorizing Provider  albuterol (VENTOLIN HFA) 108 (90 Base) MCG/ACT inhaler Inhale 2 puffs into the lungs every 6 (six) hours as needed for wheezing or shortness of breath. 05/13/22   Eden Emms, NP  amLODipine (NORVASC) 5 MG tablet Take 1 tablet (5 mg total) by mouth daily. 05/13/22   Eden Emms, NP  atorvastatin (LIPITOR) 40 MG tablet Take 1 tablet (40 mg total) by mouth daily. 09/17/21   Eden Emms, NP  benzonatate (TESSALON) 100 MG capsule Take 1 capsule (100 mg total) by mouth every 6 (six) hours as needed for cough. 07/01/22   Leslye Peer, MD  cyclobenzaprine (FLEXERIL) 5 MG tablet Take 1 tablet (5 mg total) by mouth 2 (two) times daily as needed for muscle spasms (headaches). 09/15/21   Eden Emms, NP  diclofenac Sodium (VOLTAREN ARTHRITIS PAIN) 1 % GEL Apply 4 g topically 4 (four) times daily. 06/22/22   Valentino Nose, NP  ferrous sulfate 325 (65 FE) MG tablet Take 325 mg by mouth daily with breakfast.    [provider]  fluticasone (FLONASE) 50 MCG/ACT nasal spray Place 2 sprays into both nostrils daily. 03/19/22   Eden Emms,  NP  HYDROcodone bit-homatropine (HYCODAN) 5-1.5 MG/5ML syrup Take 5 mLs by mouth every 6 (six) hours as needed for cough. 07/01/22   Leslye Peer, MD  levocetirizine (XYZAL) 5 MG tablet Take 1 tablet (5 mg total) by mouth every evening. 05/13/22   Eden Emms, NP  metoprolol succinate (TOPROL-XL) 25 MG 24 hr tablet Take 2 tablets (50 mg total) by mouth 2 (two) times daily. 05/13/22   Eden Emms, NP  omeprazole (PRILOSEC) 40 MG capsule Take 1 capsule (40 mg total) by mouth daily. 05/13/22   Eden Emms, NP  ondansetron (ZOFRAN-ODT) 4 MG disintegrating tablet Take 1 tablet (4 mg total) by mouth every 8 (eight) hours as needed for nausea or vomiting. 05/13/22   Eden Emms, NP  pantoprazole (PROTONIX) 40 MG tablet Take 1 tablet (40 mg total) by mouth 2 (two) times daily. 07/01/22   Leslye Peer, MD      Allergies    Penicillins    Review of Systems   Review of Systems  Cardiovascular:  Positive for chest pain.    Physical Exam Updated Vital Signs BP 104/83   Pulse (!) 44   Temp 98.7 F (37.1 C) (Oral)   Resp 12   SpO2 98%  Physical Exam Vitals and nursing note reviewed.  Cardiovascular:  Rate and Rhythm: Normal rate and regular rhythm.  Pulmonary:     Breath sounds: No decreased breath sounds or wheezing.  Chest:     Chest wall: No tenderness.  Abdominal:     Tenderness: There is no abdominal tenderness.  Musculoskeletal:     Right lower leg: No edema.     Left lower leg: No edema.  Skin:    General: Skin is warm.     Capillary Refill: Capillary refill takes less than 2 seconds.  Neurological:     Mental Status: He is alert.     ED Results / Procedures / Treatments   Labs (all labs ordered are listed, but only abnormal results are displayed) Labs Reviewed  BASIC METABOLIC PANEL - Abnormal; Notable for the following components:      Result Value   Potassium 3.3 (*)    Glucose, Bld 124 (*)    Calcium 8.4 (*)    All other components within normal  limits  CBC  TROPONIN I (HIGH SENSITIVITY)  TROPONIN I (HIGH SENSITIVITY)    EKG EKG Interpretation  Date/Time:  Wednesday August 12 2022 08:48:58 EDT Ventricular Rate:  72 PR Interval:  139 QRS Duration: 109 QT Interval:  402 QTC Calculation: 440 R Axis:   13 Text Interpretation: Sinus rhythm Low voltage, precordial leads RSR' in V1 or V2, right VCD or RVH No significant change since last tracing Confirmed by Benjiman Core [phone]) on 08/12/2022 8:55:22 AM  Radiology DG Chest Portable 1 View  Result Date: 08/12/2022 CLINICAL DATA:  Central chest pain with shortness of breath this morning. History of hypertension and SVT. EXAM: PORTABLE CHEST 1 VIEW COMPARISON:  CT 06/18/2022.  Radiographs 03/19/2022 and 08/23/2019. FINDINGS: 0909 hours. The heart size and mediastinal contours are normal. The lungs are clear. There is no pleural effusion or pneumothorax. No acute osseous findings are identified. Telemetry leads overlie the chest. IMPRESSION: No evidence of active cardiopulmonary process. Electronically Signed   By: Carey Bullocks M.D.   On: 08/12/2022 09:18    Procedures Procedures    Medications Ordered in ED Medications - No data to display  ED Course/ Medical Decision Making/ A&P                             Medical Decision Making Amount and/or Complexity of Data Reviewed Labs: ordered. Radiology: ordered.   Patient anterior chest pain.  Felt heart racing.  Does have history of SVT.  In a normal sinus rhythm with reassuring EKG here.  Will get chest x-ray.  Chest x-ray is reassuring. Will also get delta troponins that now in 2 hours to evaluate for cardiac ischemia.  Cardiac CT 3 years ago that was reassuring.   Differential diagnose includes arrhythmia, pneumonia, MI.  Workup reassuring.  Chest x-ray reassuring.  Troponin negative x 2.  Does have some mild bradycardia and occasional PVCs.  No severe arrhythmia seen..  Stable for discharge home.  Patient states his  cardiologist "Dr. Wanita Chamberlain".  States he can follow-up with them.  Will discharge.       Final Clinical Impression(s) / ED Diagnoses Final diagnoses:  Nonspecific chest pain    Rx / DC Orders ED Discharge Orders     None         Benjiman Core, MD 08/12/22 1220

## 2022-08-12 NOTE — ED Triage Notes (Signed)
Pt BIB [facility] from work d/t chest pressure that started while he was "walking around." Rated his pain 9/10, denied radiation, Lt arm numbness, some nausea- no vomiting, Hx of MI 5 years ago (per pt). EMS reports 12 L unremarkable, 140/80, 90 bpm, 100% on RA, was given 4 baby ASA & 1 nitroglycerin. Chest pressure then decreased to 6/10, nausea subsided & BP was 116/76.

## 2022-08-12 NOTE — Discharge Instructions (Signed)
Follow-up with your cardiologist.  Your workup today was reassuring.

## 2022-08-13 ENCOUNTER — Telehealth: Payer: Self-pay

## 2022-08-13 NOTE — Transitions of Care (Post Inpatient/ED Visit) (Signed)
08/13/2022  Name: Collin Mitchell MRN: [id number] DOB: January 20, 1971  Today's TOC FU Call Status: Today's TOC FU Call Status:: Successful TOC FU Call Competed TOC FU Call Complete Date: 08/13/22  Transition Care Management Follow-up Telephone Call Date of Discharge: 08/12/22 Discharge [facility]) Type of Discharge: Emergency Department Reason for ED Visit: Cardiac Conditions Cardiac Conditions Diagnosis:  (Nonspecific chest pain) How have you been since you were released from the hospital?: Better Any questions or concerns?: (S) Yes Patient Questions/Concerns:: pt needs a note for when returning to work for it to say light duty  Items Reviewed: Did you receive and understand the discharge instructions provided?: Yes Medications obtained,verified, and reconciled?: Yes (Medications Reviewed) Any new allergies since your discharge?: No Dietary orders reviewed?: Yes Do you have support at home?: Yes  Medications Reviewed Today: Medications Reviewed Today     Reviewed by Merleen Nicely, LPN (Licensed Practical Nurse) on 08/13/22 at 1221  Med List Status: <None>   Medication Order Taking? Sig Documenting Provider Last Dose Status Informant  albuterol (VENTOLIN HFA) 108 (90 Base) MCG/ACT inhaler [id number] Yes Inhale 2 puffs into the lungs every 6 (six) hours as needed for wheezing or shortness of breath. Eden Emms, NP Taking Active   amLODipine (NORVASC) 5 MG tablet [id number] Yes Take 1 tablet (5 mg total) by mouth daily. Eden Emms, NP Taking Active   atorvastatin (LIPITOR) 40 MG tablet [id number] Yes Take 1 tablet (40 mg total) by mouth daily. Eden Emms, NP Taking Active   benzonatate (TESSALON) 100 MG capsule [id number] Yes Take 1 capsule (100 mg total) by mouth every 6 (six) hours as needed for cough. Leslye Peer, MD Taking Active   cyclobenzaprine (FLEXERIL) 5 MG tablet [id number] Yes Take 1 tablet (5 mg total) by mouth 2 (two) times daily as needed  for muscle spasms (headaches). Eden Emms, NP Taking Active   diclofenac Sodium (VOLTAREN ARTHRITIS PAIN) 1 % GEL [id number] Yes Apply 4 g topically 4 (four) times daily. Valentino Nose, NP Taking Active   ferrous sulfate 325 (65 FE) MG tablet [id number] Yes Take 325 mg by mouth daily with breakfast. [provider] Taking Active   fluticasone (FLONASE) 50 MCG/ACT nasal spray [id number] Yes Place 2 sprays into both nostrils daily. Eden Emms, NP Taking Active   HYDROcodone bit-homatropine [facility]) 5-1.5 MG/5ML syrup [id number] Yes Take 5 mLs by mouth every 6 (six) hours as needed for cough. Leslye Peer, MD Taking Active   levocetirizine (XYZAL) 5 MG tablet [id number] Yes Take 1 tablet (5 mg total) by mouth every evening. Eden Emms, NP Taking Active   metoprolol succinate (TOPROL-XL) 25 MG 24 hr tablet [id number] Yes Take 2 tablets (50 mg total) by mouth 2 (two) times daily. Eden Emms, NP Taking Active   omeprazole (PRILOSEC) 40 MG capsule [id number] Yes Take 1 capsule (40 mg total) by mouth daily. Eden Emms, NP Taking Active   ondansetron (ZOFRAN-ODT) 4 MG disintegrating tablet [id number] Yes Take 1 tablet (4 mg total) by mouth every 8 (eight) hours as needed for nausea or vomiting. Eden Emms, NP Taking Active   pantoprazole (PROTONIX) 40 MG tablet [id number] Yes Take 1 tablet (40 mg total) by mouth 2 (two) times daily. Leslye Peer, MD Taking Active             Home Care and Equipment/Supplies: Were Home Health Services Ordered?: NA Any new equipment or medical supplies ordered?:  NA  Functional Questionnaire: Do you need assistance with bathing/showering or dressing?: No Do you need assistance with meal preparation?: No Do you need assistance with eating?: No Do you have difficulty maintaining continence: No Do you need assistance with getting out of bed/getting out of a chair/moving?: No Do you have difficulty managing or taking your medications?:  No  Follow up appointments reviewed: PCP Follow-up appointment confirmed?: Yes Date of PCP follow-up appointment?: 08/17/22 Follow-up Provider: Mordecai Maes NP Specialist Hospital Follow-up appointment confirmed?: Yes Date of Specialist follow-up appointment?: 08/24/22 Follow-Up Specialty Provider:: pulmonologist Do you need transportation to your follow-up appointment?: No Do you understand care options if your condition(s) worsen?: Yes-patient verbalized understanding    SIGNATURE  Woodfin Ganja LPN [facility] Nurse Health Advisor Direct Dial [phone]

## 2022-08-17 ENCOUNTER — Encounter: Payer: Self-pay | Admitting: Nurse Practitioner

## 2022-08-17 ENCOUNTER — Ambulatory Visit: Payer: 59 | Admitting: Nurse Practitioner

## 2022-08-17 VITALS — BP 132/80 | HR 68 | Temp 98.8°F | Ht 71.5 in | Wt 182.6 lb

## 2022-08-17 DIAGNOSIS — R079 Chest pain, unspecified: Secondary | ICD-10-CM

## 2022-08-17 DIAGNOSIS — Z8249 Family history of ischemic heart disease and other diseases of the circulatory system: Secondary | ICD-10-CM

## 2022-08-17 DIAGNOSIS — Z09 Encounter for follow-up examination after completed treatment for conditions other than malignant neoplasm: Secondary | ICD-10-CM | POA: Diagnosis not present

## 2022-08-17 NOTE — Assessment & Plan Note (Signed)
Patient's mother has a history of CAD and valvular heart heart disease.  Patient has a personal history of SVT status post ablation with a sibling of the same.  Ambulatory referral to cardiology

## 2022-08-17 NOTE — Assessment & Plan Note (Signed)
Did review ED note labs and imaging.

## 2022-08-17 NOTE — Patient Instructions (Signed)
Nice to see you today I am going to refer you to a cardiologist I will write you out on lite duty for a week

## 2022-08-17 NOTE — Assessment & Plan Note (Signed)
Nonspecific but was responsive to nitroglycerin administered by EMS.  States it helped quite a bit given family history of CAD will refer patient to cardiology.  States it happened at his job which is a new role for him which has been moving back-and-forth a lot.  Will write patient on light duty for a week give him a break and reassess thereafter.  Did inform patient if he has chest pain with exertion to sit down and rest and if it resolves that is good if not needs to be evaluated.

## 2022-08-17 NOTE — Progress Notes (Signed)
Established Patient Office Visit  Subjective   Patient ID: Collin Mitchell, male    DOB: 11/08/1970  Age: 52 y.o. MRN: [id number]  Chief Complaint  Patient presents with   Follow-up    TCM follow up for chest pain. Pt states that chest pain has slightly gotten better but with constant dry coughing. Pain level at 1. Pt. Scheduled to see lung specialist on 08/24/22     HPI  Hospital follow-up: Patient was seen in the emergency department on 08/12/2022 with diagnosis of nonspecific chest pain.  Patient was having activity when he had left anterior chest pain did improve with nitroglycerin use.  Patient underwent chest x-ray, EKG, labs that were all within normal limits.  Patient does have a cardiologist with a history of SVT s/p ablation .  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 States that he did take a nitroglycerin and it did help and he was transported via EMS to the hospital  States that he was seen in [place] and had an ablation and has been 5-6 years.  No chest pain since going the hospital. States that he has not been back to work and has been doing the new job for approx 1 month    Review of Systems  Constitutional:  Negative for chills and fever.  Respiratory:  Positive for cough. Negative for shortness of breath.   Cardiovascular:  Negative for chest pain and leg swelling.  Neurological:  Negative for dizziness and headaches.      Objective:     BP 132/80   Pulse 68   Temp 98.8 F (37.1 C) (Temporal)   Ht 5' 11.5" (1.816 m)   Wt 182 lb 9.6 oz (82.8 kg)   SpO2 95%   BMI 25.11 kg/m    Physical Exam Vitals and nursing note reviewed.  Constitutional:      Appearance: Normal appearance.  Cardiovascular:     Rate and Rhythm: Normal rate and regular rhythm.      Heart sounds: Normal heart sounds.  Pulmonary:     Effort: Pulmonary effort is normal.     Breath sounds: Normal breath sounds.  Musculoskeletal:     Right lower leg: No edema.     Left lower leg: No edema.  Neurological:     Mental Status: He is alert.      No results found for any visits on 08/17/22.    The 10-year ASCVD risk score (Arnett DK, et al., 2019) is: 6%    Assessment & Plan:   Problem List Items Addressed This Visit       Other   Nonspecific chest pain - Primary    Nonspecific but was responsive to nitroglycerin administered by EMS.  States it helped quite a bit given family history of CAD will refer patient to cardiology.  States it happened at his job which is a new role for him which has been moving back-and-forth a lot.  Will write patient on light duty for a week give him a break and reassess thereafter.  Did inform patient if he has chest pain with exertion to sit down and rest and if it resolves that is good if not needs to be evaluated.      Relevant Orders  Ambulatory referral to Cardiology   Hospital discharge follow-up    Did review ED note labs and imaging.      Family history of heart disease    Patient's mother has a history of CAD and valvular heart heart disease.  Patient has a personal history of SVT status post ablation with a sibling of the same.  Ambulatory referral to cardiology       Return if symptoms worsen or fail to improve.    Audria Nine, NP

## 2022-08-24 ENCOUNTER — Ambulatory Visit (INDEPENDENT_AMBULATORY_CARE_PROVIDER_SITE_OTHER): Payer: 59 | Admitting: Emergency Medicine

## 2022-08-24 DIAGNOSIS — R053 Chronic cough: Secondary | ICD-10-CM

## 2022-08-24 LAB — PULMONARY FUNCTION TEST
DL/VA % pred: 131 %
DL/VA: 5.82 ml/min/mmHg/L
DLCO cor % pred: 96 %
DLCO cor: 27.27 ml/min/mmHg
DLCO unc % pred: 97 %
DLCO unc: 27.42 ml/min/mmHg
FEF 25-75 Post: 0.77 L/sec
FEF 25-75 Pre: 1.58 L/sec
FEF2575-%Change-Post: -51 %
FEF2575-%Pred-Post: 23 %
FEF2575-%Pred-Pre: 48 %
FEV1-%Change-Post: -51 %
FEV1-%Pred-Post: 28 %
FEV1-%Pred-Pre: 58 %
FEV1-Post: 1.07 L
FEV1-Pre: 2.2 L
FEV1FVC-%Change-Post: -48 %
FEV1FVC-%Pred-Pre: 72 %
FEV6-%Change-Post: -6 %
FEV6-%Pred-Post: 78 %
FEV6-%Pred-Pre: 83 %
FEV6-Post: 3.65 L
FEV6-Pre: 3.91 L
FEV6FVC-%Change-Post: -1 %
FEV6FVC-%Pred-Post: 102 %
FEV6FVC-%Pred-Pre: 103 %
FVC-%Change-Post: -5 %
FVC-%Pred-Post: 76 %
FVC-%Pred-Pre: 81 %
FVC-Post: 3.7 L
FVC-Pre: 3.93 L
Post FEV1/FVC ratio: 29 %
Post FEV6/FVC ratio: 99 %
Pre FEV1/FVC ratio: 56 %
Pre FEV6/FVC Ratio: 100 %
RV % pred: 474 %
RV: 9.6 L
TLC % pred: 176 %
TLC: 11.92 L

## 2022-08-24 NOTE — Patient Instructions (Signed)
Full PFT performed today. °

## 2022-08-24 NOTE — Progress Notes (Signed)
Full PFT performed today. °

## 2022-08-26 ENCOUNTER — Ambulatory Visit (INDEPENDENT_AMBULATORY_CARE_PROVIDER_SITE_OTHER): Payer: 59 | Admitting: Emergency Medicine

## 2022-08-26 ENCOUNTER — Encounter: Payer: Self-pay | Admitting: Emergency Medicine

## 2022-08-26 VITALS — BP 118/68 | HR 83 | Temp 98.1°F | Ht 69.0 in | Wt 182.4 lb

## 2022-08-26 DIAGNOSIS — R053 Chronic cough: Secondary | ICD-10-CM

## 2022-08-26 DIAGNOSIS — R9389 Abnormal findings on diagnostic imaging of other specified body structures: Secondary | ICD-10-CM | POA: Diagnosis not present

## 2022-08-26 NOTE — Assessment & Plan Note (Signed)
Small cluster of tree-in-bud micro nodularity peripheral right lower lobe, 3 mm posterior left lower lobe perifissural nodule without any bronchiectasis.  We will plan a 36-month follow-up, next in November.

## 2022-08-26 NOTE — Assessment & Plan Note (Signed)
Please increase your omeprazole to 20 mg twice a day.  Take this medication 1 hour around food if possible Try to raise the head of your bed to avoid reflux at night while you are sleeping Continue your allergy and congestion medications as you have been taking them

## 2022-08-26 NOTE — Addendum Note (Signed)
Addended by: Dorisann Frames R on: 08/26/2022 03:25 PM   Modules accepted: Orders

## 2022-08-26 NOTE — Progress Notes (Signed)
Subjective:    Patient ID: Collin Mitchell, male    DOB: 03/30/1970, 52 y.o.   MRN: [id number]  HPI [name] is 38, never smoker with history of hypertension, hyperlipidemia, SVT post ablation, GERD with Barrett's, childhood asthma.  He is referred today for chronic cough, abnormal chest imaging. He has been dealing with cough for about 1 year - seemed to start after a URI. He feels a globus sensation, usually non-productive. He has been tried on prednisone / abx about 6 months ago without any relief..  States that it started.  His omeprazole was increased from 20 mg to 40 mg daily and he was also started on an antihistamine and fluticasone nasal spray. Also tried  No real change in his cough burden.  He coughs at night, wakes him from sleep. A CT chest was performed to further evaluate as below Sometimes gets ulcers on his tongue, sometimes gets white plaques on his tongue.  No fevers, wt loss, sweats.   CBC 05/14/22, normal WBC but no diff.   CT chest 06/18/2022 reviewed by me, shows no mediastinal or hilar adenopathy.  There is a small cluster of tree-in-bud micro nodularity in the peripheral right lower lobe as well as a 3 mm posterior left lower lobe perifissural nodule.  No evidence of bronchiectasis.  ROV 08/26/2022 --follow-up visit for 52 year old gentleman, never smoker with history of GERD and Barrett's esophagus, childhood asthma.  I saw him 2 months ago for chronic cough and a cluster of tree-in-bud micro nodularity in the peripheral right lower lobe on CT scan of the chest.  I temporarily change his omeprazole to pantoprazole, continue Xyzal and fluticasone nasal spray.  Also treated him with a short course of prednisone, Tessalon, Hycodan to see if we could eradicate his cough. Today he reports that his cough has improved. He still coughs at night, can wake him up. He does feel some GERD at night. He benefited from the pred. He uses albuterol 1-2x a week.   Review of Systems As per  HPI  Past Medical History:  Diagnosis Date   Hypercholesterolemia    Hypertension    SVT (supraventricular tachycardia)    s/p ablation     Family History  Problem Relation Age of Onset   Hypertension Mother    Valvular heart disease Mother    CAD Mother 36   COPD Father    Supraventricular tachycardia Sister        s/p ablation   Stroke Sister        mini strokes   CAD Maternal Grandmother    Colon cancer Neg Hx    Esophageal cancer Neg Hx    Stomach cancer Neg Hx    Rectal cancer Neg Hx      Social History   Socioeconomic History   Marital status: Married    Spouse name: Not on file   Number of children: 4   Years of education: Not on file   Highest education level: High school graduate  Occupational History   Occupation: manual labor  Tobacco Use   Smoking status: Never    Passive exposure: Past   Smokeless tobacco: Never  Vaping Use   Vaping Use: Never used  Substance and Sexual Activity   Alcohol use: No   Drug use: No   Sexual activity: Not on file  Other Topics Concern   Not on file  Social History Narrative   Fulltime: Administrator, 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 8 g 0   amLODipine (NORVASC) 5 MG tablet Take 1 tablet (5 mg total) by mouth daily. 90 tablet 1   atorvastatin (LIPITOR) 40 MG tablet Take 1 tablet (40 mg total) by mouth daily. 90 tablet 3   benzonatate (TESSALON) 100 MG capsule Take 1 capsule  (100 mg total) by mouth every 6 (six) hours as needed for cough. 30 capsule 1   cyclobenzaprine (FLEXERIL) 5 MG tablet Take 1 tablet (5 mg total) by mouth 2 (two) times daily as needed for muscle spasms (headaches). 30 tablet 0   diclofenac Sodium (VOLTAREN ARTHRITIS PAIN) 1 % GEL Apply 4 g topically 4 (four) times daily. 50 g 0   ferrous sulfate 325 (65 FE) MG tablet Take 325 mg by mouth daily with breakfast.     fluticasone (FLONASE) 50 MCG/ACT nasal spray Place 2 sprays into both nostrils daily. 16 g 0   levocetirizine (XYZAL) 5 MG tablet Take 1 tablet (5 mg total) by mouth every evening. 30 tablet 1   metoprolol succinate (TOPROL-XL) 25 MG 24 hr tablet Take 2 tablets (50 mg total) by mouth 2 (two) times daily. 120 tablet 5   omeprazole (PRILOSEC) 40 MG capsule Take 1 capsule (40 mg total) by mouth daily. 90 capsule 0   ondansetron (ZOFRAN-ODT) 4 MG disintegrating tablet Take 1 tablet (4 mg total) by mouth every 8 (eight) hours as needed for nausea or vomiting. 20 tablet 0   HYDROcodone bit-homatropine (HYCODAN) 5-1.5 MG/5ML syrup Take 5 mLs by mouth every 6 (six) hours as needed for cough. (Patient not taking: Reported on 08/26/2022) 240 mL 0   pantoprazole (PROTONIX) 40 MG tablet Take 1 tablet (40 mg total) by mouth 2 (two) times daily. (Patient not taking: Reported on 08/26/2022) 60 tablet 0   No facility-administered medications prior to visit.        Objective:   Physical Exam  Vitals:   08/26/22 1453  BP: 118/68  Pulse: 83  Temp: 98.1 F (36.7 C)  TempSrc: Oral  SpO2: 96%  Weight: 182 lb 6.4 oz (82.7 kg)  Height: 5\' 9"  (1.753 m)   Gen: Pleasant, well-nourished, in no distress,  normal affect  ENT: No lesions,  mouth clear,  oropharynx clear, no postnasal drip  Neck: No JVD, no stridor  Lungs: No use of accessory muscles, no crackles or wheezing on normal respiration, no wheeze on forced expiration  Cardiovascular: RRR, heart sounds normal, no murmur or gallops, no  peripheral edema  Musculoskeletal: No deformities, no cyanosis or clubbing  Neuro: alert, awake, non focal  Skin: Warm, no lesions or rash     Assessment & Plan:   Chronic cough Please increase your omeprazole to 20 mg twice a day.  Take this medication 1 hour around food if possible Try to raise the head of your bed to avoid reflux at night while you are sleeping Continue your allergy and  congestion medications as you have been taking them  Abnormal CT of the chest Small cluster of tree-in-bud micro nodularity peripheral right lower lobe, 3 mm posterior left lower lobe perifissural nodule without any bronchiectasis.  We will plan a 58-month follow-up, next in November.   Levy Pupa, MD, PhD 08/26/2022, 3:16 PM [facility] [phone] or if no answer before 7:00PM call [phone] For any issues after 7:00PM please call eLink [phone]

## 2022-08-26 NOTE — Patient Instructions (Addendum)
Please increase your omeprazole to 20 mg twice a day.  Take this medication 1 hour around food if possible Try to raise the head of your bed to avoid reflux at night while you are sleeping Continue your allergy and congestion medications as you have been taking them We will repeat your CT scan of the chest in November 2024 Follow Dr. Delton Coombes in November after your CT so we can review the results together.

## 2022-10-01 ENCOUNTER — Other Ambulatory Visit: Payer: Self-pay | Admitting: Nurse Practitioner

## 2022-10-01 DIAGNOSIS — E7800 Pure hypercholesterolemia, unspecified: Secondary | ICD-10-CM

## 2022-11-16 ENCOUNTER — Ambulatory Visit: Payer: 59 | Attending: Internal Medicine | Admitting: Internal Medicine

## 2022-11-16 ENCOUNTER — Encounter: Payer: Self-pay | Admitting: Internal Medicine

## 2022-11-16 ENCOUNTER — Ambulatory Visit: Payer: 59

## 2022-11-16 VITALS — BP 104/60 | HR 55 | Ht 69.0 in | Wt 189.0 lb

## 2022-11-16 DIAGNOSIS — I471 Supraventricular tachycardia, unspecified: Secondary | ICD-10-CM

## 2022-11-16 DIAGNOSIS — R0609 Other forms of dyspnea: Secondary | ICD-10-CM | POA: Diagnosis not present

## 2022-11-16 DIAGNOSIS — I10 Essential (primary) hypertension: Secondary | ICD-10-CM | POA: Diagnosis not present

## 2022-11-16 DIAGNOSIS — R079 Chest pain, unspecified: Secondary | ICD-10-CM

## 2022-11-16 DIAGNOSIS — I499 Cardiac arrhythmia, unspecified: Secondary | ICD-10-CM

## 2022-11-16 NOTE — Progress Notes (Signed)
Cardiology Office Note:  .    Date:  11/16/2022  ID:  Collin Mitchell, DOB [birth year], MRN [id number] PCP: Eden Emms, NP  [facility] Providers Cardiologist:  Christell Constant, MD     CC: palpitations Consulted for the evaluation of chest pain at the behest of Collin Mitchell   History of Present Illness: Collin Mitchell    Collin Mitchell is a 52 y.o. male with prior work up at [facility].  Has COPD without smoking related to COPD.  Discussed the use of AI scribe software for clinical note transcription with the patient, who gave verbal consent to proceed.  History of Present Illness          Collin Mitchell, a 52 year old male with a history of supraventricular tachycardia (SVT) and a zero calcium score in 2021, presents with chest pain and rapid heartbeat. These symptoms are particularly noticeable during physical exertion at his job in a [employer]. He also reports experiencing shortness of breath. The rapid heartbeat can occur both during exertion and at rest, with the patient describing the sensations as slightly different in each scenario.  Recently, while helping his mother in the yard, he experienced significant chest tightness, which required him to rest until the discomfort eased. He also reports a strong family history of heart problems, with his father dying at 42 from heart-related issues. His sisters also have similar heart rhythm issues. The patient has never smoked and has slightly elevated cholesterol levels. He had an electrophysiology study and ablation for SVT eight years ago.  Relevant histories: .  Social he has known COPD despite not smoking, comes with SO ROS: As per HPI.   Studies Reviewed: .   Cardiac Studies & Procedures          CT SCANS  CT CORONARY MORPH W/CTA COR W/SCORE 08/24/2019  Addendum 08/24/2019  2:38 PM ADDENDUM REPORT: 08/24/2019 14:36  CLINICAL DATA:  52 year old with chest pain ongoing, prior SVT ablation.  EXAM: Cardiac/Coronary   CTA  TECHNIQUE: The patient was scanned on a [employer].  FINDINGS: A 120 kV prospective scan was triggered in the descending thoracic aorta at 111 HU's. Axial non-contrast 3 mm slices were carried out through the heart. The data set was analyzed on a dedicated work station and scored using the Agatson method. Gantry rotation speed was 250 msecs and collimation was .6 mm. Beta blockade and 0.8 mg of sl NTG was given. The 3D data set was reconstructed in 5% intervals of the 67-82 % of the R-R cycle. Diastolic phases were analyzed on a dedicated work station using MPR, MIP and VRT modes. The patient received 80 cc of contrast.  Aorta:  Normal size.  No calcifications.  No dissection.  Aortic Valve:  Trileaflet.  Mild annular calcification.  Coronary Arteries:  Normal coronary origin.  Right dominance.  RCA is a large dominant artery that gives rise to PDA and PLA. There is no plaque.  Left main is a large artery that gives rise to LAD and LCX arteries.  LAD is a large vessel that has no plaque.  LCX is a non-dominant artery that gives rise to one large OM1 branch. There is no plaque.  There is stair step artifact from palpitation. Images are interpretable when combining phases.  Other findings:  Normal pulmonary vein drainage into the left atrium.  Normal left atrial appendage without a thrombus.  Normal size of the pulmonary artery.  Please see radiology report for  non cardiac findings.  IMPRESSION: 1. Coronary calcium score of 0. This was 0 percentile for age and sex matched control.  2. Normal coronary origin with right dominance.  3. No evidence of CAD.  4. Minimal aortic annular calcification.  5. CAD-RADS 0. No evidence of CAD (0%). Consider non-atherosclerotic causes of chest pain.  Collin Schultz, MD [facility]   Electronically Signed By: Collin Schultz MD On: 08/24/2019 14:36  Narrative EXAM: OVER-READ INTERPRETATION  CT CHEST  The  following report is an over-read performed by radiologist Dr. Genevive Mitchell of [facility] on 08/24/2019. This over-read does not include interpretation of cardiac or coronary anatomy or pathology. The CTA interpretation by the cardiologist is attached.  COMPARISON:  None.  FINDINGS: Limited view of the lung parenchyma demonstrates no suspicious nodularity. Airways are normal.  Limited view of the mediastinum demonstrates no adenopathy. Esophagus normal.  Limited view of the upper abdomen unremarkable.  Limited view of the skeleton and chest wall is unremarkable.  IMPRESSION: No significant extracardiac findings.  Electronically Signed: By: Collin Mitchell M.D. On: 08/24/2019 13:38          RADIOLOGY Calcium Score: Zero calcium (09/23/2019) CT Scan: No evidence of blockage; COPD changes, increased air space, aortic atherosclerosis (06/2022)   DIAGNOSTIC EP Study: Dual AV nodal physiology, no inducible SVT (2017) report only; they do not describe ablation.  Physical Exam:    VS:  BP 104/60   Pulse (!) 55   Ht 5\' 9"  (1.753 m)   Wt 189 lb (85.7 kg)   SpO2 96%   BMI 27.91 kg/m    Wt Readings from Last 3 Encounters:  11/16/22 189 lb (85.7 kg)  08/26/22 182 lb 6.4 oz (82.7 kg)  08/17/22 182 lb 9.6 oz (82.8 kg)    Gen: no distress  Neck: No JVD Cardiac: No Rubs or Gallops, no murmur, Regula bradycardia, +2 radial pulses Respiratory: Clear to auscultation bilaterally, normal effort, normal  respiratory rate GI: Soft, nontender, non-distended  MS: No  edema;  moves all extremities Integument: Skin feels warm Neuro:  At time of evaluation, alert and oriented to person/place/time/situation  Psych: Normal affect, patient feels ok, presently   ASSESSMENT AND PLAN: .    Supraventricular Tachycardia (SVT) History of SVT with ablation in 2017. Currently experiencing frequent palpitations, both at rest and with exertion. -Order a heart monitor to assess  for recurrent SVT. -Consider antiarrhythmic medication if SVT is confirmed and stress test is negative (would consider 1c agent as he has baseline sinus bradycardia)  Chest Pain with Exertion Despite a zero calcium score in 2021, patient reports chest tightness with exertion. Strong family history of heart disease. -Order a stress test to rule out coronary artery disease.  Chronic Obstructive Pulmonary Disease (COPD) - Patient is a never smoker but has significant secondhand smoke exposure. CT scan shows changes consistent with COPD. - defer to PCP; in the future, may change metoprolol to bisoprolol  Hyperlipidemia Aortic atherosclerosis - Slightly elevated cholesterol levels. Patient is making lifestyle changes to address this. -Recheck cholesterol levels at next visit to assess effectiveness of lifestyle changes (fasting lipids and Lp(a))   Team follow up in 3 months   Riley Lam, MD FASE [facility] Cardiologist [facility]  [address] [place], [address] [place], [place] [address] [phone]  5:49 PM

## 2022-11-16 NOTE — Patient Instructions (Addendum)
Medication Instructions:  Your physician recommends that you continue on your current medications as directed. Please refer to the Current Medication list given to you today.  *If you need a refill on your cardiac medications before your next appointment, please call your pharmacy*   Lab Work: NONE If you have labs (blood work) drawn today and your tests are completely normal, you will receive your results only by: MyChart Message (if you have MyChart) OR A paper copy in the mail If you have any lab test that is abnormal or we need to change your treatment, we will call you to review the results.   Testing/Procedures: Your physician has requested that you have a cardiac PET Scan.    Follow-Up: At [facility], you and your health needs are our priority.  As part of our continuing mission to provide you with exceptional heart care, we have created designated Provider Care Teams.  These Care Teams include your primary Cardiologist (physician) and Advanced Practice Providers (APPs -  Physician Assistants and Nurse Practitioners) who all work together to provide you with the care you need, when you need it.  We recommend signing up for the patient portal called "MyChart".  Sign up information is provided on this After Visit Summary.  MyChart is used to connect with patients for Virtual Visits (Telemedicine).  Patients are able to view lab/test results, encounter notes, upcoming appointments, etc.  Non-urgent messages can be sent to your provider as well.   To learn more about what you can do with MyChart, go to [email or web address].    Your next appointment:   3 month(s)  Provider:   Jari Favre, PA-C, Ronie Spies, PA-C, Robin Searing, NP, Jacolyn Reedy, PA-C, Eligha Bridegroom, NP, Tereso Newcomer, PA-C, or Perlie Gold, PA-C       Other Instructions How to Prepare for Your Cardiac PET/CT Stress Test:  1. Please do not take these medications before your test:   Medications that  may interfere with the cardiac pharmacological stress agent (ex. nitrates - including erectile dysfunction medications, isosorbide mononitrate, tamulosin or beta-blockers) the day of the exam. (Erectile dysfunction medication should be held for at least 72 hrs prior to test) Theophylline containing medications for 12 hours. Dipyridamole 48 hours prior to the test. Your remaining medications may be taken with water.  2. Nothing to eat or drink, except water, 3 hours prior to arrival time.   NO caffeine/decaffeinated products, or chocolate 12 hours prior to arrival.  3. NO perfume, cologne or lotion on chest or abdomen area.          - FEMALES - Please avoid wearing dresses to this appointment.  4. Total time is 1 to 2 hours; you may want to bring reading material for the waiting time.  5. Please report to Radiology at the [facility] Main Entrance 30 minutes early for your test.  [address] [place], [place] [address]  6. Please report to Radiology at [facility] Main Entrance, medical mall, 30 mins prior to your test.  [address]  [place], [place]  [phone]  Diabetic Preparation:  Hold oral medications. You may take NPH and Lantus insulin. Do not take Humalog or Humulin R (Regular Insulin) the day of your test. Check blood sugars prior to leaving the house. If able to eat breakfast prior to 3 hour fasting, you may take all medications, including your insulin, Do not worry if you miss your breakfast dose of insulin -  start at your next meal. Patients who wear a continuous glucose monitor MUST remove the device prior to scanning.  IF YOU THINK YOU MAY BE PREGNANT, OR ARE NURSING PLEASE INFORM THE TECHNOLOGIST.  In preparation for your appointment, medication and supplies will be purchased.  Appointment availability is limited, so if you need to cancel or reschedule, please call the Radiology Department at [phone] [facility]) OR  [phone] [facility])  24 hours in advance to avoid a cancellation fee of $100.00  What to Expect After you Arrive:  Once you arrive and check in for your appointment, you will be taken to a preparation room within the Radiology Department.  A technologist or Nurse will obtain your medical history, verify that you are correctly prepped for the exam, and explain the procedure.  Afterwards,  an IV will be started in your arm and electrodes will be placed on your skin for EKG monitoring during the stress portion of the exam. Then you will be escorted to the PET/CT scanner.  There, staff will get you positioned on the scanner and obtain a blood pressure and EKG.  During the exam, you will continue to be connected to the EKG and blood pressure machines.  A small, safe amount of a radioactive tracer will be injected in your IV to obtain a series of pictures of your heart along with an injection of a stress agent.    After your Exam:  It is recommended that you eat a meal and drink a caffeinated beverage to counter act any effects of the stress agent.  Drink plenty of fluids for the remainder of the day and urinate frequently for the first couple of hours after the exam.  Your doctor will inform you of your test results within 7-10 business days.  For more information and frequently asked questions, please visit our website : [email or web address]  For questions about your test or how to prepare for your test, please call: Cardiac Imaging Nurse Navigators Office: [phone]      ZIO XT- Long Term Monitor Instructions  Your physician has requested you wear a ZIO patch monitor for 14 days.  This is a single patch monitor. [employer] supplies one patch monitor per enrollment. Additional stickers are not available. Please do not apply patch if you will be having a Nuclear Stress Test,  Cardiac CT, MRI, or Chest Xray during the period you would be wearing the  monitor. The patch cannot be worn  during these tests. You cannot remove and re-apply the  ZIO XT patch monitor.  Your ZIO patch monitor will be mailed 3 day USPS to your address on file. It may take 3-5 days  to receive your monitor after you have been enrolled.  Once you have received your monitor, please review the enclosed instructions. Your monitor  has already been registered assigning a specific monitor serial # to you.  Billing and Patient Assistance Program Information  We have supplied [employer] with any of your insurance information on file for billing purposes. [employer] offers a sliding scale Patient Assistance Program for patients that do not have  insurance, or whose insurance does not completely cover the cost of the ZIO monitor.  You must apply for the Patient Assistance Program to qualify for this discounted rate.  To apply, please call [employer] at [phone], select option 4, select option 2, ask to apply for  Patient Assistance Program. Meredeth Ide will ask your household income, and how many people  are in your household. They will  quote your out-of-pocket cost based on that information.  [employer] will also be able to set up a 75-month, interest-free payment plan if needed.  Applying the monitor   Shave hair from upper left chest.  Hold abrader disc by orange tab. Rub abrader in 40 strokes over the upper left chest as  indicated in your monitor instructions.  Clean area with 4 enclosed alcohol pads. Let dry.  Apply patch as indicated in monitor instructions. Patch will be placed under collarbone on left  side of chest with arrow pointing upward.  Rub patch adhesive wings for 2 minutes. Remove white label marked "1". Remove the white  label marked "2". Rub patch adhesive wings for 2 additional minutes.  While looking in a mirror, press and release button in center of patch. A small green light will  flash 3-4 times. This will be your only indicator that the monitor has been turned on.  Do not shower for the  first 24 hours. You may shower after the first 24 hours.  Press the button if you feel a symptom. You will hear a small click. Record Date, Time and  Symptom in the Patient Logbook.  When you are ready to remove the patch, follow instructions on the last 2 pages of Patient  Logbook. Stick patch monitor onto the last page of Patient Logbook.  Place Patient Logbook in the blue and white box. Use locking tab on box and tape box closed  securely. The blue and white box has prepaid postage on it. Please place it in the mailbox as  soon as possible. Your physician should have your test results approximately 7 days after the  monitor has been mailed back to [facility].  Call [facility] Customer Care at [phone] if you have questions regarding  your ZIO XT patch monitor. Call them immediately if you see an orange light blinking on your  monitor.  If your monitor falls off in less than 4 days, contact our Monitor department at [phone].  If your monitor becomes loose or falls off after 4 days call [employer] at [phone] for  suggestions on securing your monitor

## 2022-11-16 NOTE — Progress Notes (Unsigned)
Enrolled for [employer] to mail a ZIO XT long term holter monitor to the patients address on file.  

## 2022-11-23 ENCOUNTER — Encounter (HOSPITAL_COMMUNITY): Payer: Self-pay

## 2022-11-23 ENCOUNTER — Telehealth: Payer: Self-pay | Admitting: Internal Medicine

## 2022-11-23 DIAGNOSIS — I471 Supraventricular tachycardia, unspecified: Secondary | ICD-10-CM

## 2022-11-23 NOTE — Telephone Encounter (Signed)
Orders only

## 2022-11-24 ENCOUNTER — Telehealth (HOSPITAL_COMMUNITY): Payer: Self-pay | Admitting: *Deleted

## 2022-11-24 NOTE — Telephone Encounter (Signed)
Reaching out to patient to offer assistance regarding upcoming cardiac imaging study; pt verbalizes understanding of appt date/time, parking situation and where to check in, pre-test NPO status and verified current allergies; name and call back number provided for further questions should they arise  Danette Weinfeld RN Navigator Cardiac Imaging [facility] [phone] office [phone] cell  Patient aware to avoid caffeine 12 hours prior to his cardiac PET scan. 

## 2022-11-26 ENCOUNTER — Other Ambulatory Visit: Payer: Self-pay | Admitting: Internal Medicine

## 2022-11-26 ENCOUNTER — Ambulatory Visit
Admission: RE | Admit: 2022-11-26 | Discharge: 2022-11-26 | Disposition: A | Discharge status: Home / Self Care | Payer: 59 | Source: Ambulatory Visit | Attending: Internal Medicine | Admitting: Internal Medicine

## 2022-11-26 VITALS — BP 108/55 | HR 96

## 2022-11-26 DIAGNOSIS — R0609 Other forms of dyspnea: Secondary | ICD-10-CM | POA: Diagnosis not present

## 2022-11-26 DIAGNOSIS — I471 Supraventricular tachycardia, unspecified: Secondary | ICD-10-CM | POA: Insufficient documentation

## 2022-11-26 DIAGNOSIS — R079 Chest pain, unspecified: Secondary | ICD-10-CM | POA: Insufficient documentation

## 2022-11-26 DIAGNOSIS — I8290 Acute embolism and thrombosis of unspecified vein: Secondary | ICD-10-CM | POA: Insufficient documentation

## 2022-11-26 DIAGNOSIS — I10 Essential (primary) hypertension: Secondary | ICD-10-CM | POA: Diagnosis not present

## 2022-11-26 DIAGNOSIS — R918 Other nonspecific abnormal finding of lung field: Secondary | ICD-10-CM | POA: Insufficient documentation

## 2022-11-26 DIAGNOSIS — M7989 Other specified soft tissue disorders: Secondary | ICD-10-CM

## 2022-11-26 DIAGNOSIS — M79601 Pain in right arm: Secondary | ICD-10-CM

## 2022-11-26 DIAGNOSIS — I499 Cardiac arrhythmia, unspecified: Secondary | ICD-10-CM | POA: Insufficient documentation

## 2022-11-26 LAB — NM PET CT CARDIAC PERFUSION MULTI W/ABSOLUTE BLOODFLOW
MBFR: 2.87
Nuc Rest EF: 56 %
Nuc Stress EF: 65 %
Peak HR: 107 {beats}/min
Rest HR: 57 {beats}/min
Rest MBF: 0.79 ml/g/min
Rest Nuclear Isotope Dose: 22.2 mCi
SRS: 2
SSS: 4
ST Depression (mm): 0 mm
Stress MBF: 2.27 ml/g/min
Stress Nuclear Isotope Dose: 22.2 mCi
TID: 0.82

## 2022-11-26 MED ORDER — REGADENOSON 0.4 MG/5ML IV SOLN
0.4000 mg | Freq: Once | INTRAVENOUS | Status: AC
  Administered 2022-11-26: 0.4 mg via INTRAVENOUS
  Filled 2022-11-26: qty 5

## 2022-11-26 MED ORDER — RUBIDIUM RB82 GENERATOR (RUBYFILL)
25.0000 | PACK | Freq: Once | INTRAVENOUS | Status: AC
  Administered 2022-11-26: 22.17 via INTRAVENOUS

## 2022-11-26 MED ORDER — RUBIDIUM RB82 GENERATOR (RUBYFILL)
25.0000 | PACK | Freq: Once | INTRAVENOUS | Status: AC
  Administered 2022-11-26: 22.19 via INTRAVENOUS

## 2022-11-26 MED ORDER — REGADENOSON 0.4 MG/5ML IV SOLN
INTRAVENOUS | Status: AC
  Filled 2022-11-26: qty 5

## 2022-11-26 NOTE — Progress Notes (Signed)
Patient presents for a cardiac PET stress test and tolerated procedure with some chest pain and SOB that resolved by the conclusion of the test.  Patient maintained acceptable vital signs throughout the test and was offered caffeine after test.  Patient ambulated out of department with a steady gait.  During the test, some activity was noted in the patient's right axilla area. Dr. Reche Dixon and Dr. Jacques Navy consulted. Dr. Reche Dixon believed it may be a blood clot. Dr. Izora Ribas made aware and gave verbal order for the [place] of arm.  Patient informed of situation and was agreeable to getting study done. He was escorted to [place].

## 2022-12-30 ENCOUNTER — Other Ambulatory Visit: Payer: 59

## 2023-01-17 ENCOUNTER — Other Ambulatory Visit: Payer: Self-pay | Admitting: Nurse Practitioner

## 2023-01-17 DIAGNOSIS — I10 Essential (primary) hypertension: Secondary | ICD-10-CM

## 2023-01-22 ENCOUNTER — Encounter: Payer: Self-pay | Admitting: Family Medicine

## 2023-01-22 ENCOUNTER — Ambulatory Visit (INDEPENDENT_AMBULATORY_CARE_PROVIDER_SITE_OTHER): Payer: 59 | Admitting: Family Medicine

## 2023-01-22 VITALS — BP 120/74 | HR 76 | Temp 97.6°F | Wt 196.6 lb

## 2023-01-22 DIAGNOSIS — J101 Influenza due to other identified influenza virus with other respiratory manifestations: Secondary | ICD-10-CM | POA: Diagnosis not present

## 2023-01-22 DIAGNOSIS — R1110 Vomiting, unspecified: Secondary | ICD-10-CM

## 2023-01-22 DIAGNOSIS — R053 Chronic cough: Secondary | ICD-10-CM | POA: Diagnosis not present

## 2023-01-22 LAB — POCT INFLUENZA A/B
Influenza A, POC: POSITIVE — AB
Influenza B, POC: NEGATIVE

## 2023-01-22 MED ORDER — PREDNISONE 50 MG PO TABS
ORAL_TABLET | ORAL | 0 refills | Status: AC
Start: 2023-01-22 — End: ?

## 2023-01-22 MED ORDER — OSELTAMIVIR PHOSPHATE 75 MG PO CAPS
75.0000 mg | ORAL_CAPSULE | Freq: Two times a day (BID) | ORAL | 0 refills | Status: AC
Start: 2023-01-22 — End: ?

## 2023-01-22 MED ORDER — AZITHROMYCIN 250 MG PO TABS
ORAL_TABLET | ORAL | 0 refills | Status: AC
Start: 2023-01-22 — End: 2023-01-27

## 2023-01-22 MED ORDER — ALBUTEROL SULFATE HFA 108 (90 BASE) MCG/ACT IN AERS
2.0000 | INHALATION_SPRAY | Freq: Four times a day (QID) | RESPIRATORY_TRACT | 0 refills | Status: AC | PRN
Start: 2023-01-22 — End: ?

## 2023-01-22 NOTE — Assessment & Plan Note (Signed)
Influenza A triggering questionable COPD exacerbation.  Does not appear to have formalized diagnosis of COPD.  Plan Albuterol Inhaler: Refill prescribed; use 2 puffs every 6 hours as needed. Prednisone: 50 mg orally once daily for 5 days. Oseltamivir (Tamiflu): 75 mg orally twice daily for 5 days. Azithromycin (Z-Pak): 500 mg orally on day 1, then 250 mg once daily on days 2-5. Continue Tylenol as needed for fever and body aches. Monitor symptoms closely. Seek immediate medical attention if experiencing worsening shortness of breath or chest pain. Follow up with the clinic if symptoms do not improve.
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
## 2023-02-16 ENCOUNTER — Encounter: Payer: Self-pay | Admitting: Nurse Practitioner

## 2023-02-16 NOTE — Telephone Encounter (Signed)
 Called and spoke to pt that I did not see a diagnosis of COPD on his chart and he would need to come in a see Adina Crandall to discuss his options. He then said his pulmonologist said he had COPD. So, I advised him that his pulmonologist needed to be the one that writes medications for it. He will call them.

## 2023-02-23 NOTE — Progress Notes (Deleted)
   Cardiology Office Note:    Date:  02/23/2023  ID:  Collin Mitchell, DOB 1970-03-18, MRN [id number] PCP: Wendee Lynwood HERO, NP   [facility] Providers Cardiologist:  Stanly DELENA Leavens, MD { Click to update primary MD,subspecialty MD or APP then REFRESH:1}    {Click to Open Review  :1}   Patient Profile:      Supraventricular Tachycardia S/p ablation in 2017 Chronic Obstructive Pulmonary Disease  Never smoked; +2nd hand smoke Chest pain  CAC score in 09/23/2019: 0 Myocardial PET 11/26/22: low risk, no ischemia, no infarction, EF 56, myocardial blood flow reserve normal (2.87) FHx CAD  Hyperlipidemia  Aortic atherosclerosis      {      :1}   History of Present Illness:  Discussed the use of AI scribe software for clinical note transcription with the patient, who gave verbal consent to proceed.  Collin Mitchell is a 53 y.o. male who returns for follow up of chest pain, palpitations. He was evaluated by Dr. Leavens in 10/2022. Myocardial PET was low risk. Monitor was ordered but has not been resulted yet.       ROS-See HPI ***    Studies Reviewed:       *** Results          Risk Assessment/Calculations:   {Does this patient have ATRIAL FIBRILLATION?[phone]} No BP recorded.  {Refresh Note OR Click here to enter BP  :1}***       Physical Exam:   VS:  There were no vitals taken for this visit.   Wt Readings from Last 3 Encounters:  01/22/23 196 lb 9.6 oz (89.2 kg)  11/16/22 189 lb (85.7 kg)  08/26/22 182 lb 6.4 oz (82.7 kg)    Physical Exam***     Assessment and Plan:   Assessment & Plan SVT (supraventricular tachycardia) (HCC)  Primary hypertension  Nonspecific chest pain   Assessment and Plan             {      :1}    {Are you ordering a CV Procedure (e.g. stress test, cath, DCCV, TEE, etc)?   Press F2        :[id number]}  Dispo:  No follow-ups on file.  Signed, Glendia Ferrier, PA-C

## 2023-02-24 ENCOUNTER — Ambulatory Visit: Payer: 59 | Attending: Physician Assistant | Admitting: Physician Assistant

## 2023-02-24 DIAGNOSIS — I10 Essential (primary) hypertension: Secondary | ICD-10-CM

## 2023-02-24 DIAGNOSIS — R079 Chest pain, unspecified: Secondary | ICD-10-CM

## 2023-02-24 DIAGNOSIS — I471 Supraventricular tachycardia, unspecified: Secondary | ICD-10-CM

## 2023-03-29 ENCOUNTER — Ambulatory Visit (INDEPENDENT_AMBULATORY_CARE_PROVIDER_SITE_OTHER)
Admission: RE | Admit: 2023-03-29 | Discharge: 2023-03-29 | Disposition: A | Discharge status: Home / Self Care | Payer: 59 | Source: Ambulatory Visit | Attending: Nurse Practitioner

## 2023-03-29 ENCOUNTER — Ambulatory Visit (INDEPENDENT_AMBULATORY_CARE_PROVIDER_SITE_OTHER): Payer: 59 | Admitting: Nurse Practitioner

## 2023-03-29 VITALS — BP 136/80 | HR 68 | Temp 97.6°F | Ht 69.0 in | Wt 192.6 lb

## 2023-03-29 DIAGNOSIS — R0789 Other chest pain: Secondary | ICD-10-CM | POA: Diagnosis not present

## 2023-03-29 DIAGNOSIS — R051 Acute cough: Secondary | ICD-10-CM | POA: Insufficient documentation

## 2023-03-29 DIAGNOSIS — R0609 Other forms of dyspnea: Secondary | ICD-10-CM

## 2023-03-29 DIAGNOSIS — R509 Fever, unspecified: Secondary | ICD-10-CM | POA: Diagnosis not present

## 2023-03-29 DIAGNOSIS — B349 Viral infection, unspecified: Secondary | ICD-10-CM | POA: Insufficient documentation

## 2023-03-29 DIAGNOSIS — R52 Pain, unspecified: Secondary | ICD-10-CM | POA: Insufficient documentation

## 2023-03-29 DIAGNOSIS — R112 Nausea with vomiting, unspecified: Secondary | ICD-10-CM | POA: Insufficient documentation

## 2023-03-29 LAB — POCT FLU A/B STATUS
Influenza A, POC: NEGATIVE
Influenza B, POC: NEGATIVE

## 2023-03-29 LAB — POC COVID19 BINAXNOW: SARS Coronavirus 2 Ag: NEGATIVE

## 2023-03-29 MED ORDER — ONDANSETRON 4 MG PO TBDP
4.0000 mg | ORAL_TABLET | Freq: Three times a day (TID) | ORAL | 0 refills | Status: AC | PRN
Start: 2023-03-29 — End: ?

## 2023-03-29 NOTE — Assessment & Plan Note (Signed)
 Pending chest x-ray.  Patient is albuterol  inhaler that he can use at home as needed

## 2023-03-29 NOTE — Patient Instructions (Addendum)
 Nice to see you today You have a viral illness.  Your flu and coivd tests were negative or normal in office) Follow up with me as scheduled or if you do not start improving I will be in touch with the xray once I have the results You can use tylenol  and ibuprofen for the fever, aches, pains, and headaches

## 2023-03-29 NOTE — Assessment & Plan Note (Signed)
 Flu and COVID test in office.  Patient use Tylenol  and ibuprofen as directed

## 2023-03-29 NOTE — Assessment & Plan Note (Signed)
 Flu and COVID test in office.  Patient use over-the-counter analgesics as needed, rest, drink plenty of fluid.

## 2023-03-29 NOTE — Progress Notes (Signed)
 Acute Office Visit  Subjective:     Patient ID: Collin Mitchell, male    DOB: 09-Sep-1970, 53 y.o.   MRN: [id number]  Chief Complaint  Patient presents with   Cough    Pt complains of prod cough, diarrhea, chills, body aches, fever, and nausea/vomiting that started 4 days ago.       Patient is in today for sick symptoms  with a history of Barrett's esophagus HLD.,  Chronic cough.  Symptoms started arppox 4 days ago.  States that his grandkids had the flu and he was exposed Flu vaccine: Not utd Covid: not utd Has been taking tylenol  and mucinex that has offered minimal relief He has been having some vomiting 2 times yesterday and 3 times today. He is also having diarrhea 2 day.    States that he did use the inhaler this morning that did help with breathing   He says that he is peeing several times a day and his urine is a clear color   Review of Systems  Constitutional:  Positive for fever and malaise/fatigue.  HENT:  Positive for congestion. Negative for ear discharge, ear pain, sinus pain and sore throat.   Respiratory:  Positive for cough, sputum production (green) and shortness of breath (DOE).   Gastrointestinal:  Positive for diarrhea, nausea and vomiting. Negative for abdominal pain.  Musculoskeletal:  Positive for myalgias.  Neurological:  Positive for headaches. Negative for dizziness.        Objective:    BP 136/80   Pulse 68   Temp 97.6 F (36.4 C) (Oral)   Ht 5\' 9"  (1.753 m)   Wt 192 lb 9.6 oz (87.4 kg)   SpO2 97%   BMI 28.44 kg/m  BP Readings from Last 3 Encounters:  03/29/23 136/80  01/22/23 120/74  11/26/22 (!) 108/55   Wt Readings from Last 3 Encounters:  03/29/23 192 lb 9.6 oz (87.4 kg)  01/22/23 196 lb 9.6 oz (89.2 kg)  11/16/22 189 lb (85.7 kg)   SpO2 Readings from Last 3 Encounters:  03/29/23 97%  01/22/23 99%  11/16/22 96%      Physical Exam Vitals and nursing note reviewed.  Constitutional:      Appearance: Normal  appearance.  HENT:     Right Ear: Tympanic membrane, ear canal and external ear normal.     Left Ear: Tympanic membrane, ear canal and external ear normal.     Mouth/Throat:     Mouth: Mucous membranes are moist.     Pharynx: Oropharynx is clear.  Cardiovascular:     Rate and Rhythm: Normal rate and regular rhythm.     Heart sounds: Normal heart sounds.  Pulmonary:     Effort: Pulmonary effort is normal.     Breath sounds: Normal breath sounds.  Abdominal:     General: Bowel sounds are normal. There is no distension.     Palpations: There is no mass.     Tenderness: There is no abdominal tenderness.     Hernia: No hernia is present.  Lymphadenopathy:     Cervical: No cervical adenopathy.  Neurological:     Mental Status: He is alert.     Results for orders placed or performed in visit on 03/29/23  POC COVID-19  Result Value Ref Range   SARS Coronavirus 2 Ag Negative Negative  POCT Flu A & B Status  Result Value Ref Range   Influenza A, POC Negative Negative   Influenza B, POC Negative Negative  Assessment & Plan:   Problem List Items Addressed This Visit       Digestive   Nausea and vomiting   Pending flu test.  Patient was written ondansetron  4 mg 3 times daily as needed.  Advance diet as tolerated      Relevant Medications   ondansetron  (ZOFRAN -ODT) 4 MG disintegrating tablet     Other   Dyspnea on exertion   Pending chest x-ray.  Patient is albuterol  inhaler that he can use at home as needed      Relevant Orders   DG Chest 2 View   Fever and chills - Primary   Flu and COVID test in office.  Patient use over-the-counter analgesics as needed, rest, drink plenty of fluid.      Relevant Orders   POC COVID-19 (Completed)   POCT Flu A & B Status (Completed)   Body aches   Flu and COVID test in office.  Patient use Tylenol  and ibuprofen as directed      Relevant Orders   POC COVID-19 (Completed)   POCT Flu A & B Status (Completed)   Acute cough    Flu and COVID test in office.      Relevant Orders   POC COVID-19 (Completed)   POCT Flu A & B Status (Completed)   DG Chest 2 View   Chest wall tenderness   Pending chest x-ray in the setting of fever cough and chest tenderness.      Relevant Orders   DG Chest 2 View   Viral illness   Flu and COVID-negative in office.  Will treat patient symptomatically.       Meds ordered this encounter  Medications   ondansetron  (ZOFRAN -ODT) 4 MG disintegrating tablet    Sig: Take 1 tablet (4 mg total) by mouth every 8 (eight) hours as needed.    Dispense:  20 tablet    Refill:  0    Supervising Provider:   Deri Fleet A [[id number]]    Return if symptoms worsen or fail to improve, for As scheduled .  Margarie Shay, NP

## 2023-03-29 NOTE — Assessment & Plan Note (Signed)
 Flu and COVID test in office.

## 2023-03-29 NOTE — Assessment & Plan Note (Signed)
 Pending flu test.  Patient was written ondansetron  4 mg 3 times daily as needed.  Advance diet as tolerated

## 2023-03-29 NOTE — Assessment & Plan Note (Signed)
 Flu and COVID-negative in office.  Will treat patient symptomatically.

## 2023-03-29 NOTE — Assessment & Plan Note (Signed)
 Pending chest x-ray in the setting of fever cough and chest tenderness.

## 2023-03-31 ENCOUNTER — Encounter: Payer: Self-pay | Admitting: Nurse Practitioner

## 2023-05-07 ENCOUNTER — Ambulatory Visit: Payer: Self-pay | Admitting: *Deleted

## 2023-05-07 ENCOUNTER — Ambulatory Visit: Admitting: Nurse Practitioner

## 2023-05-07 VITALS — BP 118/80 | HR 77 | Temp 97.9°F | Ht 69.0 in | Wt 184.6 lb

## 2023-05-07 DIAGNOSIS — R42 Dizziness and giddiness: Secondary | ICD-10-CM | POA: Diagnosis not present

## 2023-05-07 DIAGNOSIS — R197 Diarrhea, unspecified: Secondary | ICD-10-CM | POA: Diagnosis not present

## 2023-05-07 DIAGNOSIS — Z09 Encounter for follow-up examination after completed treatment for conditions other than malignant neoplasm: Secondary | ICD-10-CM

## 2023-05-07 LAB — COMPREHENSIVE METABOLIC PANEL
ALT: 18 U/L (ref 0–53)
AST: 26 U/L (ref 0–37)
Albumin: 4.1 g/dL (ref 3.5–5.2)
Alkaline Phosphatase: 108 U/L (ref 39–117)
BUN: 14 mg/dL (ref 6–23)
CO2: 31 meq/L (ref 19–32)
Calcium: 9 mg/dL (ref 8.4–10.5)
Chloride: 98 meq/L (ref 96–112)
Creatinine, Ser: 1.15 mg/dL (ref 0.40–1.50)
GFR: 72.71 mL/min (ref >60.00)
Glucose, Bld: 106 mg/dL — ABNORMAL HIGH (ref 70–99)
Potassium: 3.5 meq/L (ref 3.5–5.1)
Sodium: 138 meq/L (ref 135–145)
Total Bilirubin: 0.6 mg/dL (ref 0.2–1.2)
Total Protein: 7.1 g/dL (ref 6.0–8.3)

## 2023-05-07 LAB — CBC
HCT: 47 % (ref 39.0–52.0)
Hemoglobin: 16.2 g/dL (ref 13.0–17.0)
MCHC: 34.5 g/dL (ref 30.0–36.0)
MCV: 89 fl (ref 78.0–100.0)
Platelets: 212 10*3/uL (ref 150.0–400.0)
RBC: 5.29 Mil/uL (ref 4.22–5.81)
RDW: 13.2 % (ref 11.5–15.5)
WBC: 7.6 10*3/uL (ref 4.0–10.5)

## 2023-05-07 NOTE — Assessment & Plan Note (Signed)
 Likely secondary to dehydration given diarrhea.  Patient is monitoring fluid encouraged 64+ ounces of water or electrolyte drinks a day.  Slow position changes.  Patient will not return to work today he states he is okay to go tomorrow note given for today

## 2023-05-07 NOTE — Assessment & Plan Note (Signed)
 Did review ED note, imaging, labs.

## 2023-05-07 NOTE — Telephone Encounter (Signed)
 Chief Complaint: diarrhea, dizziness Symptoms: diarrhea x 12-13 times in past 24 hours. Abdominal "bubbles" no severe pain no cramping.  Everytime drinks fluids 30 min. -1 hour has diarrhea again watery now black in color , has been taking pepto bismol. Last episode of vomiting yesterday. Went to ED , got IV fluids still some dizziness reported. Not tolerating liquids for very long and not tolerating food at all. Pt daughter with similar sx Frequency: 3-4 days, but initial sx started on Sunday  Pertinent Negatives: Patient denies fever, no c/o chest pain no difficulty breathing no clammy skin  Disposition: [] ED /[] Urgent Care (no appt availability in office) / [x] Appointment(In office/virtual)/ []  [facility]/ [] Home Care/ [] Refused Recommended Disposition /[]  Mobile Bus/ []  Follow-up with PCP Additional Notes:   Appt scheduled today due to being seen in ED on Wednesday. Recommended if sx worsen go to ED.      Copied from CRM [id number]. Topic: Clinical - Red Word Triage >> May 07, 2023  8:46 AM Adele Barthel wrote: Red Word that prompted transfer to Nurse Triage:   Believes he has food poisoning  Symptoms for 3-4 days Vomiting Diarrhea, stool is black Went to [facility] Emergency Department on Wed Given IV fluids because was dehydrated Not having any improvement. Reason for Disposition  [1] SEVERE diarrhea (e.g., 7 or more times / day more than normal) AND [2] age > 60 years  Answer Assessment - Initial Assessment Questions 1. DIARRHEA SEVERITY: "How bad is the diarrhea?" "How many more stools have you had in the past 24 hours than normal?"    - NO DIARRHEA (SCALE 0)   - MILD (SCALE 1-3): Few loose or mushy BMs; increase of 1-3 stools over normal daily number of stools; mild increase in ostomy output.   -  MODERATE (SCALE 4-7): Increase of 4-6 stools daily over normal; moderate increase in ostomy output.   -  SEVERE (SCALE 8-10; OR "WORST  POSSIBLE"): Increase of 7 or more stools daily over normal; moderate increase in ostomy output; incontinence.     Severe approx 12 past 24  2. ONSET: "When did the diarrhea begin?"      3-4 day ago 3. BM CONSISTENCY: "How loose or watery is the diarrhea?"      Watery  4. VOMITING: "Are you also vomiting?" If Yes, ask: "How many times in the past 24 hours?"      Vomiting yesterday morning  5. ABDOMEN PAIN: "Are you having any abdomen pain?" If Yes, ask: "What does it feel like?" (e.g., crampy, dull, intermittent, constant)      "Bubbles" in stomach  6. ABDOMEN PAIN SEVERITY: If present, ask: "How bad is the pain?"  (e.g., Scale 1-10; mild, moderate, or severe)   - MILD (1-3): doesn't interfere with normal activities, abdomen soft and not tender to touch    - MODERATE (4-7): interferes with normal activities or awakens from sleep, abdomen tender to touch    - SEVERE (8-10): excruciating pain, doubled over, unable to do any normal activities       Moderate 7/10 not sleeping  7. ORAL INTAKE: If vomiting, "Have you been able to drink liquids?" "How much liquids have you had in the past 24 hours?"     Drinking liquids but diarrhea approx 30- 1 hour later 8. HYDRATION: "Any signs of dehydration?" (e.g., dry mouth [not just dry lips], too weak to stand, dizziness, new weight loss) "When did you last urinate?"  dizziness 9. EXPOSURE: "Have you traveled to a foreign country recently?" "Have you been exposed to anyone with diarrhea?" "Could you have eaten any food that was spoiled?"     Ate on Sunday and step daughter with same sx on Tuesday  10. ANTIBIOTIC USE: "Are you taking antibiotics now or have you taken antibiotics in the past 2 months?"       Na  11. OTHER SYMPTOMS: "Do you have any other symptoms?" (e.g., fever, blood in stool)       Stools dark is taking pepto bismol.  12. PREGNANCY: "Is there any chance you are pregnant?" "When was your last menstrual period?"       na  Protocols  used: [facility]

## 2023-05-07 NOTE — Patient Instructions (Addendum)
 Nice to see you today You need AT LEAST 64oz of water/electrolyte drinks a day If you continue to have diarrhea next week let me know and we will check the stool  Follow up if you do not improve   Follow up with me in 3 months for your physical

## 2023-05-07 NOTE — Telephone Encounter (Signed)
 Patient was evaluated in office

## 2023-05-07 NOTE — Progress Notes (Signed)
 Acute Office Visit  Subjective:     Patient ID: Collin Mitchell, male    DOB: [birth year], 53 y.o.   MRN: [id number]  Chief Complaint  Patient presents with   Hospitalization Follow-up    Pt complains of constant diarrhea, dizziness, and nausea/vommiting that started on Tuesday. States that he is not vommiting as much. No appetite. Pt complains that he had to leave work today due to dizziness. Pt states that he had a pork chop dinner at restaurant on Sunday. Got sick on Tuesday, mentions that food was a little raw.     HPI Patient is in today for hospital follow up   Patient was seen in the emergency department on 05/05/2023 at [facility] in [place].  He presented to the emergency department with abdominal pain with vomiting diarrhea for the past 2 days.  Family numbers have been sick with similar type symptoms he does report cramping abdominal pain.  He also endorsed to call that is consistent with his previous COPD diagnosis.  Patient is under CAT scan given a liter of normal saline for Zofran and was able to complete a fluid challenge.  Viral swabs were negative.  CT scan showed large bowel with inflamed volume; suggestive of a nonspecific colitis States taht over the past 2 days he is having a lot of diarrhea. States that he is having 6 episodes with black stool but is on pepto bismol. States that the pepto is not helping. States that he took an anitdirrhea pill that did not help.  States that he is trying to drink plenty of fluids. States that he is drinking a cup of water a day and not eating anything   States that his step daughter had the same symptoms ad is over it minus the diarrhea.   Review of Systems  Constitutional:  Positive for malaise/fatigue. Negative for chills and fever.  Respiratory:  Negative for shortness of breath.   Cardiovascular:  Negative for chest pain.  Gastrointestinal:  Positive for diarrhea. Negative for abdominal pain, nausea and vomiting.   Neurological:  Positive for dizziness and headaches.        Objective:    BP 118/80   Pulse 77   Temp 97.9 F (36.6 C) (Oral)   Ht 5\' 9"  (1.753 m)   Wt 184 lb 9.6 oz (83.7 kg)   SpO2 96%   BMI 27.26 kg/m    Physical Exam Vitals and nursing note reviewed.  Constitutional:      Appearance: Normal appearance.  Cardiovascular:     Rate and Rhythm: Normal rate and regular rhythm.     Heart sounds: Normal heart sounds.  Pulmonary:     Effort: Pulmonary effort is normal.     Breath sounds: Normal breath sounds.  Abdominal:     General: Bowel sounds are normal. There is no distension.     Palpations: There is no mass.     Tenderness: There is no abdominal tenderness.     Hernia: No hernia is present.  Neurological:     Mental Status: He is alert.     No results found for any visits on 05/07/23.      Assessment & Plan:   Problem List Items Addressed This Visit       Digestive   Diarrhea of presumed infectious origin   History of same.  Patient recently had CT of abdomen pelvis that showed colitis is likely still taking time to resolve.  Will check labs given patient  having lightheadedness and still 6+ stools a day.  Patient encouraged to advance diet as tolerated and drink at least 64 ounces of electrolytes or water a day      Relevant Orders   CBC   Comprehensive metabolic panel     Other   Hospital discharge follow-up - Primary   Did review ED note, imaging, labs.      Lightheadedness   Likely secondary to dehydration given diarrhea.  Patient is monitoring fluid encouraged 64+ ounces of water or electrolyte drinks a day.  Slow position changes.  Patient will not return to work today he states he is okay to go tomorrow note given for today       No orders of the defined types were placed in this encounter.   Return in about 3 months (around 08/07/2023) for CPE and Labs.  Audria Nine, NP

## 2023-05-07 NOTE — Assessment & Plan Note (Signed)
 History of same.  Patient recently had CT of abdomen pelvis that showed colitis is likely still taking time to resolve.  Will check labs given patient having lightheadedness and still 6+ stools a day.  Patient encouraged to advance diet as tolerated and drink at least 64 ounces of electrolytes or water a day

## 2023-05-11 ENCOUNTER — Encounter: Payer: Self-pay | Admitting: Nurse Practitioner

## 2023-05-17 ENCOUNTER — Encounter: Payer: Self-pay | Admitting: Nurse Practitioner

## 2023-05-17 ENCOUNTER — Ambulatory Visit (INDEPENDENT_AMBULATORY_CARE_PROVIDER_SITE_OTHER): Payer: 59 | Admitting: Nurse Practitioner

## 2023-05-17 ENCOUNTER — Ambulatory Visit: Attending: Nurse Practitioner

## 2023-05-17 VITALS — BP 112/80 | HR 63 | Temp 97.7°F | Ht 69.5 in | Wt 180.8 lb

## 2023-05-17 DIAGNOSIS — Z125 Encounter for screening for malignant neoplasm of prostate: Secondary | ICD-10-CM | POA: Diagnosis not present

## 2023-05-17 DIAGNOSIS — E663 Overweight: Secondary | ICD-10-CM | POA: Insufficient documentation

## 2023-05-17 DIAGNOSIS — Z0000 Encounter for general adult medical examination without abnormal findings: Secondary | ICD-10-CM | POA: Diagnosis not present

## 2023-05-17 DIAGNOSIS — E7800 Pure hypercholesterolemia, unspecified: Secondary | ICD-10-CM

## 2023-05-17 DIAGNOSIS — E611 Iron deficiency: Secondary | ICD-10-CM

## 2023-05-17 DIAGNOSIS — I471 Supraventricular tachycardia, unspecified: Secondary | ICD-10-CM | POA: Diagnosis not present

## 2023-05-17 DIAGNOSIS — I10 Essential (primary) hypertension: Secondary | ICD-10-CM

## 2023-05-17 DIAGNOSIS — Z131 Encounter for screening for diabetes mellitus: Secondary | ICD-10-CM

## 2023-05-17 DIAGNOSIS — R002 Palpitations: Secondary | ICD-10-CM

## 2023-05-17 LAB — LIPID PANEL
Cholesterol: 155 mg/dL (ref 0–200)
HDL: 35.9 mg/dL — ABNORMAL LOW (ref >39.00)
LDL Cholesterol: 107 mg/dL — ABNORMAL HIGH (ref 0–99)
NonHDL: 119.37
Total CHOL/HDL Ratio: 4
Triglycerides: 63 mg/dL (ref 0.0–149.0)
VLDL: 12.6 mg/dL (ref 0.0–40.0)

## 2023-05-17 LAB — HEMOGLOBIN A1C: Hgb A1c MFr Bld: 5.6 % (ref 4.6–6.5)

## 2023-05-17 LAB — PSA: PSA: 0.91 ng/mL (ref 0.10–4.00)

## 2023-05-17 LAB — TSH: TSH: 1.86 u[IU]/mL (ref 0.35–5.50)

## 2023-05-17 MED ORDER — METOPROLOL SUCCINATE ER 25 MG PO TB24: 50.0000 mg | ORAL_TABLET | Freq: Two times a day (BID) | ORAL | 11 refills | Status: AC

## 2023-05-17 MED ORDER — AMLODIPINE BESYLATE 5 MG PO TABS: 5.0000 mg | ORAL_TABLET | Freq: Every day | ORAL | 3 refills | Status: AC

## 2023-05-17 MED ORDER — ATORVASTATIN CALCIUM 40 MG PO TABS: 40.0000 mg | ORAL_TABLET | Freq: Every day | ORAL | 3 refills | Status: AC

## 2023-05-17 NOTE — Progress Notes (Signed)
 Established Patient Office Visit  Subjective   Patient ID: Collin Mitchell, male    DOB: Feb 10, 1971  Age: 53 y.o. MRN: [id number]  Chief Complaint  Patient presents with   Annual Exam    Refill for metoprolol, amlodipine, and atorvastatin     HPI  HTN: on amlodipine 5, metoprolol 50mg  BID. States that he is getting reading 119/80. Checks it twice a day with his pohne   Barretts: omeprazole and has done upper GI 12/2021 and recommend on 3 year follow   HLD: Patient currently maintained on atorvastatin 40 mg daily.  for complete physical and follow up of chronic conditions.  Immunizations: -Tetanus: Completed in 2018 -Influenza: refused  -Shingles:  refused  -Pneumonia: too young   Diet: Fair diet. 2 meals a day. States that he will snack. He drinks water  Exercise: No regular exercise.employment   Eye exam: Completes annually. Needs updating wears glasses   Dental exam: needs updating   Colonoscopy: Completed in 12/29/2021, needs updating  Lung Cancer Screening: N/A  PSA: Due  Sleep: going to bed around 9 and will get up around 5am. Feels rested most of the time. He is working two jobs. He working 7 days a week.   Chest tightness: states that he was working when he got chest tightness and felt that his heart was pounding. It lasted for over 24 hours and then resovled.       Review of Systems  Constitutional:  Negative for chills and fever.  Respiratory:  Negative for shortness of breath.   Cardiovascular:  Negative for chest pain and leg swelling.  Gastrointestinal:  Negative for abdominal pain, blood in stool, constipation, diarrhea, nausea and vomiting.       BM everyday   Genitourinary:  Negative for dysuria and hematuria.  Neurological:  Negative for tingling and headaches.  Psychiatric/Behavioral:  Negative for hallucinations and suicidal ideas.       Objective:     BP 112/80   Pulse 63   Temp 97.7 F (36.5 C) (Oral)   Ht 5' 9.5" (1.765 m)   Wt  180 lb 12.8 oz (82 kg)   SpO2 99%   BMI 26.32 kg/m  BP Readings from Last 3 Encounters:  05/17/23 112/80  05/07/23 118/80  03/29/23 136/80   Wt Readings from Last 3 Encounters:  05/17/23 180 lb 12.8 oz (82 kg)  05/07/23 184 lb 9.6 oz (83.7 kg)  03/29/23 192 lb 9.6 oz (87.4 kg)   SpO2 Readings from Last 3 Encounters:  05/17/23 99%  05/07/23 96%  03/29/23 97%      Physical Exam Vitals and nursing note reviewed.  Constitutional:      Appearance: Normal appearance.  HENT:     Right Ear: Tympanic membrane, ear canal and external ear normal.     Left Ear: Tympanic membrane, ear canal and external ear normal.     Mouth/Throat:     Mouth: Mucous membranes are moist.     Pharynx: Oropharynx is clear.  Eyes:     Extraocular Movements: Extraocular movements intact.     Pupils: Pupils are equal, round, and reactive to light.  Cardiovascular:     Rate and Rhythm: Normal rate and regular rhythm.     Pulses: Normal pulses.     Heart sounds: Normal heart sounds.  Pulmonary:     Effort: Pulmonary effort is normal.     Breath sounds: Normal breath sounds.  Abdominal:     General: Bowel sounds are  normal. There is no distension.     Palpations: There is no mass.     Tenderness: There is no abdominal tenderness.     Hernia: No hernia is present.  Genitourinary:    Comments: deferred Musculoskeletal:     Right lower leg: No edema.     Left lower leg: No edema.  Lymphadenopathy:     Cervical: No cervical adenopathy.  Skin:    General: Skin is warm.  Neurological:     General: No focal deficit present.     Mental Status: He is alert.     Deep Tendon Reflexes:     Reflex Scores:      Bicep reflexes are 2+ on the right side and 2+ on the left side.      Patellar reflexes are 2+ on the right side and 2+ on the left side.    Comments: Bilateral upper and lower extremity strength 5/5  Psychiatric:        Mood and Affect: Mood normal.        Behavior: Behavior normal.         Thought Content: Thought content normal.        Judgment: Judgment normal.      No results found for any visits on 05/17/23.    The 10-year ASCVD risk score (Arnett DK, et al., 2019) is: 4.9%    Assessment & Plan:   Problem List Items Addressed This Visit       Cardiovascular and Mediastinum   SVT (supraventricular tachycardia) (HCC)   History of the same.  He has been seen by EP in the past.  Patient on metoprolol 50 mg twice daily.  Continue medications prescribed to review most recent EKG done in September 2024      Relevant Medications   amLODipine (NORVASC) 5 MG tablet   atorvastatin (LIPITOR) 40 MG tablet   metoprolol succinate (TOPROL-XL) 25 MG 24 hr tablet   Other Relevant Orders   LONG TERM MONITOR (3-14 DAYS)   Hypertension   Patient currently maintained on amlodipine 5 mg amlodipine milligrams twice daily.  Blood pressure well-controlled.  He is checking blood pressure at home.  Continue medication as prescribed      Relevant Medications   amLODipine (NORVASC) 5 MG tablet   atorvastatin (LIPITOR) 40 MG tablet   metoprolol succinate (TOPROL-XL) 25 MG 24 hr tablet   Other Relevant Orders   Hemoglobin A1c   TSH     Other   Hypercholesterolemia   Patient currently maintained on atorvastatin 40 mg daily.  Pending lipid panel today.      Relevant Medications   amLODipine (NORVASC) 5 MG tablet   atorvastatin (LIPITOR) 40 MG tablet   metoprolol succinate (TOPROL-XL) 25 MG 24 hr tablet   Other Relevant Orders   Hemoglobin A1c   Lipid panel   Iron deficiency   History of the same patient no longer on iron supplementation      Preventative health care - Primary   Discussed age-appropriate immunizations and screening exams.  Did review patient's personal, surgical, social, family histories.  Patient up-to-date on all age-appropriate vaccinations he would like.  Patient declined flu vaccine today.  Patient needs to redo colonoscopy due to suboptimal prep.  PSA  done today for prostate cancer screening.  Patient was given information at discharge about preventative healthcare maintenance with anticipatory guidance.      Overweight   Pending TSH, A1c, lipid panel.      Relevant Orders  Hemoglobin A1c   Lipid panel   Palpitations   History of SVT with chest tightness and heart pounding.  Lasted for little over a day.  Curious if this is a rebound of his SVT or palpitations.  Will do a 14-day at home heart monitor.  Patient has a recurrence of symptoms he will reach out and let me know if      Relevant Orders   LONG TERM MONITOR (3-14 DAYS)   Other Visit Diagnoses       Screening for prostate cancer       Relevant Orders   PSA     Screening for diabetes mellitus           Return in about 1 year (around 05/16/2024) for CPE and Labs.    Audria Nine, NP

## 2023-05-17 NOTE — Patient Instructions (Signed)
 Nice to see you today I have sent the heart monitor to the house. Wear it and then send it back as instructed  Follow up with me in 1 year, sooner if you need me

## 2023-05-17 NOTE — Assessment & Plan Note (Signed)
 Patient currently maintained on atorvastatin 40 mg daily.  Pending lipid panel today.

## 2023-05-17 NOTE — Assessment & Plan Note (Signed)
 History of the same.  He has been seen by EP in the past.  Patient on metoprolol 50 mg twice daily.  Continue medications prescribed to review most recent EKG done in September 2024

## 2023-05-17 NOTE — Assessment & Plan Note (Signed)
 History of the same patient no longer on iron supplementation

## 2023-05-17 NOTE — Assessment & Plan Note (Signed)
 Pending TSH, A1c, lipid panel.

## 2023-05-17 NOTE — Assessment & Plan Note (Signed)
 History of SVT with chest tightness and heart pounding.  Lasted for little over a day.  Curious if this is a rebound of his SVT or palpitations.  Will do a 14-day at home heart monitor.  Patient has a recurrence of symptoms he will reach out and let me know if

## 2023-05-17 NOTE — Assessment & Plan Note (Signed)
 Patient currently maintained on amlodipine 5 mg amlodipine milligrams twice daily.  Blood pressure well-controlled.  He is checking blood pressure at home.  Continue medication as prescribed

## 2023-05-17 NOTE — Assessment & Plan Note (Signed)
 Discussed age-appropriate immunizations and screening exams.  Did review patient's personal, surgical, social, family histories.  Patient up-to-date on all age-appropriate vaccinations he would like.  Patient declined flu vaccine today.  Patient needs to redo colonoscopy due to suboptimal prep.  PSA done today for prostate cancer screening.  Patient was given information at discharge about preventative healthcare maintenance with anticipatory guidance.

## 2023-05-18 ENCOUNTER — Encounter: Payer: Self-pay | Admitting: Nurse Practitioner

## 2023-07-21 ENCOUNTER — Other Ambulatory Visit: Payer: Self-pay | Admitting: Family Medicine

## 2023-07-21 DIAGNOSIS — R053 Chronic cough: Secondary | ICD-10-CM

## 2023-07-30 ENCOUNTER — Emergency Department (HOSPITAL_COMMUNITY)
Admission: EM | Admit: 2023-07-30 | Discharge: 2023-07-30 | Disposition: A | Discharge status: Home / Self Care | Attending: Emergency Medicine | Admitting: Emergency Medicine

## 2023-07-30 ENCOUNTER — Encounter (HOSPITAL_COMMUNITY): Payer: Self-pay

## 2023-07-30 ENCOUNTER — Emergency Department (HOSPITAL_COMMUNITY)

## 2023-07-30 ENCOUNTER — Other Ambulatory Visit: Payer: Self-pay

## 2023-07-30 DIAGNOSIS — J449 Chronic obstructive pulmonary disease, unspecified: Secondary | ICD-10-CM | POA: Insufficient documentation

## 2023-07-30 DIAGNOSIS — J45909 Unspecified asthma, uncomplicated: Secondary | ICD-10-CM | POA: Diagnosis not present

## 2023-07-30 DIAGNOSIS — Z79899 Other long term (current) drug therapy: Secondary | ICD-10-CM | POA: Diagnosis not present

## 2023-07-30 DIAGNOSIS — I10 Essential (primary) hypertension: Secondary | ICD-10-CM | POA: Diagnosis not present

## 2023-07-30 DIAGNOSIS — R079 Chest pain, unspecified: Secondary | ICD-10-CM | POA: Insufficient documentation

## 2023-07-30 LAB — CBC
HCT: 46.9 % (ref 39.0–52.0)
Hemoglobin: 15.7 g/dL (ref 13.0–17.0)
MCH: 30.3 pg (ref 26.0–34.0)
MCHC: 33.5 g/dL (ref 30.0–36.0)
MCV: 90.4 fL (ref 80.0–100.0)
Platelets: 277 10*3/uL (ref 150–400)
RBC: 5.19 MIL/uL (ref 4.22–5.81)
RDW: 12.6 % (ref 11.5–15.5)
WBC: 8.8 10*3/uL (ref 4.0–10.5)
nRBC: 0 % (ref 0.0–0.2)

## 2023-07-30 LAB — TROPONIN I (HIGH SENSITIVITY)
Troponin I (High Sensitivity): 4 ng/L (ref <18)
Troponin I (High Sensitivity): 3 ng/L (ref <18)

## 2023-07-30 LAB — BASIC METABOLIC PANEL WITH GFR
Anion gap: 12 (ref 5–15)
BUN: 12 mg/dL (ref 6–20)
CO2: 21 mmol/L — ABNORMAL LOW (ref 22–32)
Calcium: 8.7 mg/dL — ABNORMAL LOW (ref 8.9–10.3)
Chloride: 106 mmol/L (ref 98–111)
Creatinine, Ser: 1.05 mg/dL (ref 0.61–1.24)
GFR, Estimated: >60 mL/min (ref >60)
Glucose, Bld: 92 mg/dL (ref 70–99)
Potassium: 3.6 mmol/L (ref 3.5–5.1)
Sodium: 139 mmol/L (ref 135–145)

## 2023-07-30 MED ORDER — ALBUTEROL SULFATE HFA 108 (90 BASE) MCG/ACT IN AERS
2.0000 | INHALATION_SPRAY | RESPIRATORY_TRACT | Status: DC | PRN
  Filled 2023-07-30: qty 6.7

## 2023-07-30 NOTE — ED Triage Notes (Signed)
 Pt c.o intermittent chest pain since last night with associated SOB. Pt denies pain at this time

## 2023-07-30 NOTE — ED Provider Notes (Signed)
 [place] EMERGENCY DEPARTMENT AT [facility] Provider Note   CSN: [id number] Arrival date & time: 07/30/23  7829     Patient presents with: Chest Pain   Collin Mitchell is a 53 y.o. male.    Chest Pain Patient with chest pain shortness of breath.  Has had for the last couple weeks.  Comes and goes.  States feels more short of breath than he had.  History of COPD.  No real cough.  At times will get some pain in his chest that does go down with his arm.  Also comes on with some exertion.  Reviewed previous cardiac workup with negative cardiac CT.    Past Medical History:  Diagnosis Date   Hypercholesterolemia    Hypertension    SVT (supraventricular tachycardia) (HCC)    s/p ablation    Prior to Admission medications   Medication Sig Start Date End Date Taking? Authorizing Provider  albuterol  (VENTOLIN  HFA) 108 (90 Base) MCG/ACT inhaler Inhale 2 puffs into the lungs every 6 (six) hours as needed for wheezing or shortness of breath. 01/22/23   Catheryn Cluck, MD  amLODipine  (NORVASC ) 5 MG tablet Take 1 tablet (5 mg total) by mouth daily. 05/17/23   Dorothe Gaster, NP  atorvastatin  (LIPITOR) 40 MG tablet Take 1 tablet (40 mg total) by mouth daily. 05/17/23   Dorothe Gaster, NP  cyclobenzaprine  (FLEXERIL ) 5 MG tablet Take 1 tablet (5 mg total) by mouth 2 (two) times daily as needed for muscle spasms (headaches). 09/15/21   Dorothe Gaster, NP  diclofenac  Sodium (VOLTAREN  ARTHRITIS PAIN) 1 % GEL Apply 4 g topically 4 (four) times daily. 06/22/22   Wilhemena Harbour, NP  ferrous sulfate 325 (65 FE) MG tablet Take 325 mg by mouth daily with breakfast.    [provider]  fluticasone  (FLONASE ) 50 MCG/ACT nasal spray Place 2 sprays into both nostrils daily. 03/19/22   Dorothe Gaster, NP  levocetirizine (XYZAL ) 5 MG tablet Take 1 tablet (5 mg total) by mouth every evening. 05/13/22   Dorothe Gaster, NP  meloxicam (MOBIC) 7.5 MG tablet Take 7.5 mg by mouth as needed for  pain. 10/08/22   [provider]  metoprolol  succinate (TOPROL -XL) 25 MG 24 hr tablet Take 2 tablets (50 mg total) by mouth 2 (two) times daily. 05/17/23   Dorothe Gaster, NP  omeprazole  (PRILOSEC) 40 MG capsule Take 1 capsule (40 mg total) by mouth daily. 05/13/22   Dorothe Gaster, NP  ondansetron  (ZOFRAN -ODT) 4 MG disintegrating tablet Take 1 tablet (4 mg total) by mouth every 8 (eight) hours as needed. 03/29/23   Dorothe Gaster, NP  oseltamivir  (TAMIFLU ) 75 MG capsule Take 1 capsule (75 mg total) by mouth 2 (two) times daily. 01/22/23   Catheryn Cluck, MD  predniSONE  (DELTASONE ) 50 MG tablet Take 1 tablet daily for 5 days. 01/22/23   Catheryn Cluck, MD  traMADol  (ULTRAM ) 50 MG tablet Take 50 mg by mouth as needed for moderate pain or severe pain. 10/08/22   [provider]    Allergies: Penicillins    Review of Systems  Cardiovascular:  Positive for chest pain.    Updated Vital Signs BP 119/81 (BP Location: Right Arm)   Pulse 67   Temp 98 F (36.7 C) (Oral)   Resp 20   Ht 5' 9 (1.753 m)   Wt 84.4 kg   SpO2 97%   BMI 27.47 kg/m   Physical  Exam Vitals and nursing note reviewed.  HENT:     Head: Atraumatic.   Cardiovascular:     Rate and Rhythm: Normal rate.  Pulmonary:     Breath sounds: No wheezing.  Chest:     Chest wall: No tenderness.  Abdominal:     Tenderness: There is no abdominal tenderness.   Musculoskeletal:     Right lower leg: No edema.     Left lower leg: No edema.   Neurological:     Mental Status: He is alert.     (all labs ordered are listed, but only abnormal results are displayed) Labs Reviewed  BASIC METABOLIC PANEL WITH GFR - Abnormal; Notable for the following components:      Result Value   CO2 21 (*)    Calcium  8.7 (*)    All other components within normal limits  CBC  TROPONIN I (HIGH SENSITIVITY)  TROPONIN I (HIGH SENSITIVITY)    EKG: EKG Interpretation Date/Time:  Friday July 30 2023 09:10:52 EDT Ventricular  Rate:  79 PR Interval:  142 QRS Duration:  102 QT Interval:  368 QTC Calculation: 422 R Axis:   7  Text Interpretation: Sinus rhythm Confirmed by Mozell Arias [phone]) on 07/30/2023 9:13:09 AM  Radiology: Lenell Query Chest Portable 1 View Result Date: 07/30/2023 CLINICAL DATA:  Chest pain.  Cough.  Shortness of breath. EXAM: PORTABLE CHEST 1 VIEW COMPARISON:  03/29/2023 FINDINGS: The lungs appear clear. Cardiac and mediastinal contours normal. No blunting of the costophrenic angles. Thoracic spondylosis. IMPRESSION: 1. No active cardiopulmonary disease is radiographically apparent. 2. Thoracic spondylosis. Electronically Signed   By: Freida Jes M.D.   On: 07/30/2023 09:37     Procedures   Medications Ordered in the ED  albuterol  (VENTOLIN  HFA) 108 (90 Base) MCG/ACT inhaler 2 puff (2 puffs Inhalation Not Given 07/30/23 1429)                                    Medical Decision Making Amount and/or Complexity of Data Reviewed Labs: ordered. Radiology: ordered.  Risk Prescription drug management.   Patient with chest pain.  Anterior chest.  Also dyspnea.  Differential diagnosis is long but includes causes such as coronary artery disease, pneumonia, COPD.  EKG reassuring.  Reviewed previous cardiology note and cardiac PET scan.  Will get x-ray basic blood work.  Blood work reassuring.  X-ray reassuring.  Doubt cardiac cause.  Appears stable for discharge.  Slight dyspnea with history of asthma.  Will give inhaler.      Final diagnoses:  Nonspecific chest pain    ED Discharge Orders     None          Mozell Arias, MD 07/30/23 1431

## 2023-09-10 ENCOUNTER — Emergency Department (HOSPITAL_COMMUNITY)

## 2023-09-10 ENCOUNTER — Other Ambulatory Visit: Payer: Self-pay

## 2023-09-10 ENCOUNTER — Emergency Department (HOSPITAL_COMMUNITY)
Admission: EM | Admit: 2023-09-10 | Discharge: 2023-09-10 | Disposition: A | Discharge status: Home / Self Care | Attending: Emergency Medicine | Admitting: Emergency Medicine

## 2023-09-10 ENCOUNTER — Ambulatory Visit (HOSPITAL_COMMUNITY): Payer: Self-pay

## 2023-09-10 ENCOUNTER — Encounter (HOSPITAL_COMMUNITY): Payer: Self-pay

## 2023-09-10 DIAGNOSIS — J449 Chronic obstructive pulmonary disease, unspecified: Secondary | ICD-10-CM | POA: Insufficient documentation

## 2023-09-10 DIAGNOSIS — R0981 Nasal congestion: Secondary | ICD-10-CM | POA: Diagnosis present

## 2023-09-10 DIAGNOSIS — R059 Cough, unspecified: Secondary | ICD-10-CM | POA: Insufficient documentation

## 2023-09-10 DIAGNOSIS — J019 Acute sinusitis, unspecified: Secondary | ICD-10-CM | POA: Diagnosis not present

## 2023-09-10 LAB — RESP PANEL BY RT-PCR (RSV, FLU A&B, COVID)  RVPGX2
Influenza A by PCR: NEGATIVE
Influenza B by PCR: NEGATIVE
Resp Syncytial Virus by PCR: NEGATIVE
SARS Coronavirus 2 by RT PCR: POSITIVE — AB

## 2023-09-10 MED ORDER — DOXYCYCLINE HYCLATE 100 MG PO CAPS: 100.0000 mg | ORAL_CAPSULE | Freq: Two times a day (BID) | ORAL | 0 refills | Status: AC

## 2023-09-10 NOTE — ED Triage Notes (Signed)
 Pt. Stated, Im pretty sure I have a sinus infection . Ive had a lot of nasal congestion started a week ago. Everything Ive taken has not worked. I have COPD  so it might be harder to cure.

## 2023-09-10 NOTE — Discharge Instructions (Signed)
 Please check MyChart for any results of test done here today Please fill your prescription today and take it all as prescribed.  Return if you are having any worsening symptoms.  Follow-up with your doctor this week

## 2023-09-10 NOTE — ED Provider Notes (Signed)
 [place] [facility] AT [facility] Provider Note   CSN: [id number] Arrival date & time: 09/10/23  9273     Patient presents with: sinus infection and Nasal Congestion   Collin Mitchell is a 53 y.o. male.   HPI 53 year old male history of COPD presents today complaining of nasal congestion and pressure.  Symptoms began approximately 10 days ago.  His wife had similar symptoms but hers resolved after 3 days.  He continued to have nasal congestion and cough.  He has not had fever.  Cough is productive of clear sputum.  He has had some mildly increased dyspnea.  He uses his albuterol  inhaler.  He is not a smoker and has never been a smoker.  He denies headache, chest pain, nausea, vomiting, or diarrhea.    Prior to Admission medications   Medication Sig Start Date End Date Taking? Authorizing Provider  doxycycline (VIBRAMYCIN) 100 MG capsule Take 1 capsule (100 mg total) by mouth 2 (two) times daily for 7 days. 09/10/23 09/17/23 Yes Levander Houston, MD  albuterol  (VENTOLIN  HFA) 108 (90 Base) MCG/ACT inhaler Inhale 2 puffs into the lungs every 6 (six) hours as needed for wheezing or shortness of breath. 01/22/23   Sebastian Beverley NOVAK, MD  amLODipine  (NORVASC ) 5 MG tablet Take 1 tablet (5 mg total) by mouth daily. 05/17/23   Wendee Lynwood HERO, NP  atorvastatin  (LIPITOR) 40 MG tablet Take 1 tablet (40 mg total) by mouth daily. 05/17/23   Wendee Lynwood HERO, NP  cyclobenzaprine  (FLEXERIL ) 5 MG tablet Take 1 tablet (5 mg total) by mouth 2 (two) times daily as needed for muscle spasms (headaches). 09/15/21   Wendee Lynwood HERO, NP  diclofenac  Sodium (VOLTAREN  ARTHRITIS PAIN) 1 % GEL Apply 4 g topically 4 (four) times daily. 06/22/22   Chandra Harlene LABOR, NP  ferrous sulfate 325 (65 FE) MG tablet Take 325 mg by mouth daily with breakfast.    [provider]  fluticasone  (FLONASE ) 50 MCG/ACT nasal spray Place 2 sprays into both nostrils daily. 03/19/22   Wendee Lynwood HERO, NP  levocetirizine  (XYZAL ) 5 MG tablet Take 1 tablet (5 mg total) by mouth every evening. 05/13/22   Wendee Lynwood HERO, NP  meloxicam (MOBIC) 7.5 MG tablet Take 7.5 mg by mouth as needed for pain. 10/08/22   [provider]  metoprolol  succinate (TOPROL -XL) 25 MG 24 hr tablet Take 2 tablets (50 mg total) by mouth 2 (two) times daily. 05/17/23   Wendee Lynwood HERO, NP  omeprazole  (PRILOSEC) 40 MG capsule Take 1 capsule (40 mg total) by mouth daily. 05/13/22   Wendee Lynwood HERO, NP  ondansetron  (ZOFRAN -ODT) 4 MG disintegrating tablet Take 1 tablet (4 mg total) by mouth every 8 (eight) hours as needed. 03/29/23   Wendee Lynwood HERO, NP  oseltamivir  (TAMIFLU ) 75 MG capsule Take 1 capsule (75 mg total) by mouth 2 (two) times daily. 01/22/23   Sebastian Beverley NOVAK, MD  predniSONE  (DELTASONE ) 50 MG tablet Take 1 tablet daily for 5 days. 01/22/23   Sebastian Beverley NOVAK, MD  traMADol  (ULTRAM ) 50 MG tablet Take 50 mg by mouth as needed for moderate pain or severe pain. 10/08/22   [provider]    Allergies: Penicillins    Review of Systems  Updated Vital Signs BP (!) 144/89 (BP Location: Right Arm)   Pulse 81   Temp 98.1 F (36.7 C)   Resp 16   Ht 1.753 m (5' 9)   Wt 84.4 kg  SpO2 97%   BMI 27.47 kg/m   Physical Exam Vitals reviewed.  Constitutional:      General: He is not in acute distress.    Appearance: He is normal weight.  HENT:     Head: Normocephalic.     Right Ear: External ear normal.     Left Ear: External ear normal.     Nose: Nose normal.     Mouth/Throat:     Pharynx: Oropharynx is clear. No oropharyngeal exudate.  Eyes:     Extraocular Movements: Extraocular movements intact.     Pupils: Pupils are equal, round, and reactive to light.  Cardiovascular:     Rate and Rhythm: Normal rate and regular rhythm.     Pulses: Normal pulses.  Pulmonary:     Effort: Pulmonary effort is normal.     Breath sounds: Normal breath sounds. No wheezing or rhonchi.  Abdominal:     General: Abdomen is flat.  Bowel sounds are normal.  Musculoskeletal:        General: Normal range of motion.     Cervical back: Normal range of motion.  Skin:    General: Skin is warm and dry.     Capillary Refill: Capillary refill takes less than 2 seconds.  Neurological:     General: No focal deficit present.     Mental Status: He is alert.  Psychiatric:        Mood and Affect: Mood normal.     (all labs ordered are listed, but only abnormal results are displayed) Labs Reviewed  RESP PANEL BY RT-PCR (RSV, FLU A&B, COVID)  [id number]    EKG: None  Radiology: DG Chest 2 View Result Date: 09/10/2023 CLINICAL DATA:  Cough.  COPD. EXAM: CHEST - 2 VIEW COMPARISON:  07/30/2023 FINDINGS: The lungs are clear without focal pneumonia, edema, pneumothorax or pleural effusion. The cardiopericardial silhouette is within normal limits for size. No acute bony abnormality. IMPRESSION: No active cardiopulmonary disease. Electronically Signed   By: Camellia Candle M.D.   On: 09/10/2023 08:49     Procedures   Medications Ordered in the ED - No data to display  Clinical Course as of 09/10/23 0943  Fri Sep 10, 2023  [id number] Chest x-Colbey Wirtanen reviewed and interpreted no evidence of acute abnormality noted on my interpretation and radiologist interpretation concurs [DR]    Clinical Course User Index [DR] Levander Houston, MD                                 Medical Decision Making Amount and/or Complexity of Data Reviewed Radiology: ordered.  53 year old male history of COPD presents today with nasal congestion and cough.  His wife has had similar symptoms but hers resolved after 3 days and his have continued.  He has a lot of pressure in his face.  He has not been able to blow anything out of his sinuses.  He has had some productive cough.  Here in the ED he is evaluated with chest x-Maribella Kuna which shows no evidence of acute infiltrate.  COVID and flu swabs have been ordered. Patient does not have any respiratory distress.  He is not  wheezing.  I suspect that he did have a URI that was viral, but it has continued with symptoms in his face consistent with sinusitis.  Will place on antibiotics.  Patient advised of return precautions need for follow-up and voices understanding.  Final diagnoses:  Acute non-recurrent sinusitis, unspecified location    ED Discharge Orders          Ordered    doxycycline (VIBRAMYCIN) 100 MG capsule  2 times daily        09/10/23 0943               Levander Houston, MD 09/10/23 [phone]

## 2024-04-07 ENCOUNTER — Ambulatory Visit: Admitting: Emergency Medicine

## 2024-05-17 ENCOUNTER — Encounter: Admitting: Nurse Practitioner
